# Patient Record
Sex: Female | Born: 1960 | Race: White | Hispanic: No | Smoking: Current every day smoker
Health system: Southern US, Community
[De-identification: ages and names within clinical notes are randomized; demographics above are authoritative.]

## PROBLEM LIST (undated history)

## (undated) DIAGNOSIS — R87619 Unspecified abnormal cytological findings in specimens from cervix uteri: Secondary | ICD-10-CM

## (undated) DIAGNOSIS — G8929 Other chronic pain: Secondary | ICD-10-CM

## (undated) DIAGNOSIS — M1990 Unspecified osteoarthritis, unspecified site: Secondary | ICD-10-CM

## (undated) DIAGNOSIS — M549 Dorsalgia, unspecified: Secondary | ICD-10-CM

## (undated) DIAGNOSIS — N879 Dysplasia of cervix uteri, unspecified: Secondary | ICD-10-CM

## (undated) HISTORY — PX: COLONOSCOPY: SHX174

## (undated) HISTORY — DX: Unspecified abnormal cytological findings in specimens from cervix uteri: R87.619

## (undated) HISTORY — DX: Dysplasia of cervix uteri, unspecified: N87.9

## (undated) HISTORY — PX: DIAGNOSTIC MAMMOGRAM: HXRAD719

## (undated) HISTORY — DX: Unspecified osteoarthritis, unspecified site: M19.90

---

## 1997-11-22 ENCOUNTER — Other Ambulatory Visit: Admission: RE | Admit: 1997-11-22 | Discharge: 1997-11-22 | Payer: Self-pay | Admitting: Obstetrics and Gynecology

## 1999-07-31 ENCOUNTER — Other Ambulatory Visit: Admission: RE | Admit: 1999-07-31 | Discharge: 1999-07-31 | Payer: Self-pay | Admitting: Obstetrics & Gynecology

## 2000-09-26 ENCOUNTER — Other Ambulatory Visit: Admission: RE | Admit: 2000-09-26 | Discharge: 2000-09-26 | Payer: Self-pay | Admitting: Obstetrics and Gynecology

## 2001-01-28 ENCOUNTER — Ambulatory Visit (HOSPITAL_COMMUNITY): Admission: RE | Admit: 2001-01-28 | Discharge: 2001-01-28 | Payer: Self-pay | Admitting: Obstetrics and Gynecology

## 2001-01-28 ENCOUNTER — Encounter: Payer: Self-pay | Admitting: Obstetrics and Gynecology

## 2002-03-31 ENCOUNTER — Other Ambulatory Visit: Admission: RE | Admit: 2002-03-31 | Discharge: 2002-03-31 | Payer: Self-pay | Admitting: Obstetrics and Gynecology

## 2002-03-31 ENCOUNTER — Ambulatory Visit (HOSPITAL_COMMUNITY): Admission: RE | Admit: 2002-03-31 | Discharge: 2002-03-31 | Payer: Self-pay | Admitting: Obstetrics and Gynecology

## 2002-03-31 ENCOUNTER — Encounter: Payer: Self-pay | Admitting: Obstetrics and Gynecology

## 2002-04-06 ENCOUNTER — Encounter: Admission: RE | Admit: 2002-04-06 | Discharge: 2002-04-06 | Payer: Self-pay | Admitting: Obstetrics and Gynecology

## 2002-04-06 ENCOUNTER — Encounter: Payer: Self-pay | Admitting: Obstetrics and Gynecology

## 2002-04-10 ENCOUNTER — Encounter: Admission: RE | Admit: 2002-04-10 | Discharge: 2002-04-10 | Payer: Self-pay | Admitting: Gastroenterology

## 2002-04-10 ENCOUNTER — Encounter: Payer: Self-pay | Admitting: Gastroenterology

## 2002-11-27 ENCOUNTER — Ambulatory Visit (HOSPITAL_COMMUNITY): Admission: RE | Admit: 2002-11-27 | Discharge: 2002-11-27 | Payer: Self-pay | Admitting: Gastroenterology

## 2003-04-15 ENCOUNTER — Other Ambulatory Visit: Admission: RE | Admit: 2003-04-15 | Discharge: 2003-04-15 | Payer: Self-pay | Admitting: Obstetrics and Gynecology

## 2003-06-29 ENCOUNTER — Ambulatory Visit (HOSPITAL_COMMUNITY): Admission: RE | Admit: 2003-06-29 | Discharge: 2003-06-29 | Payer: Self-pay | Admitting: Obstetrics and Gynecology

## 2003-12-22 ENCOUNTER — Ambulatory Visit (HOSPITAL_COMMUNITY): Admission: RE | Admit: 2003-12-22 | Discharge: 2003-12-22 | Payer: Self-pay | Admitting: Gastroenterology

## 2004-07-07 ENCOUNTER — Ambulatory Visit (HOSPITAL_COMMUNITY): Admission: RE | Admit: 2004-07-07 | Discharge: 2004-07-07 | Payer: Self-pay | Admitting: Obstetrics and Gynecology

## 2005-09-18 ENCOUNTER — Encounter: Admission: RE | Admit: 2005-09-18 | Discharge: 2005-09-18 | Payer: Self-pay | Admitting: Obstetrics and Gynecology

## 2006-09-30 ENCOUNTER — Ambulatory Visit (HOSPITAL_COMMUNITY): Admission: RE | Admit: 2006-09-30 | Discharge: 2006-09-30 | Payer: Self-pay | Admitting: Obstetrics and Gynecology

## 2006-10-10 ENCOUNTER — Encounter: Admission: RE | Admit: 2006-10-10 | Discharge: 2006-10-10 | Payer: Self-pay | Admitting: Obstetrics and Gynecology

## 2008-02-17 ENCOUNTER — Ambulatory Visit (HOSPITAL_COMMUNITY): Admission: RE | Admit: 2008-02-17 | Discharge: 2008-02-17 | Payer: Self-pay | Admitting: Obstetrics and Gynecology

## 2008-02-17 IMAGING — MG MM DIGITAL SCREENING BILAT
4 series · 4 of 4 positions shown · non-contrast
Comparison: none

DG SCREEN MAMMOGRAM BILATERAL
Bilateral CC and MLO view(s) were taken.
Technologist: PAMCITA.(PAMCITA)(M)

DIGITAL SCREENING MAMMOGRAM WITH CAD:
The breast tissue is heterogeneously dense.  No masses or malignant type calcifications are 
identified.  Compared with prior studies.

[R CC]
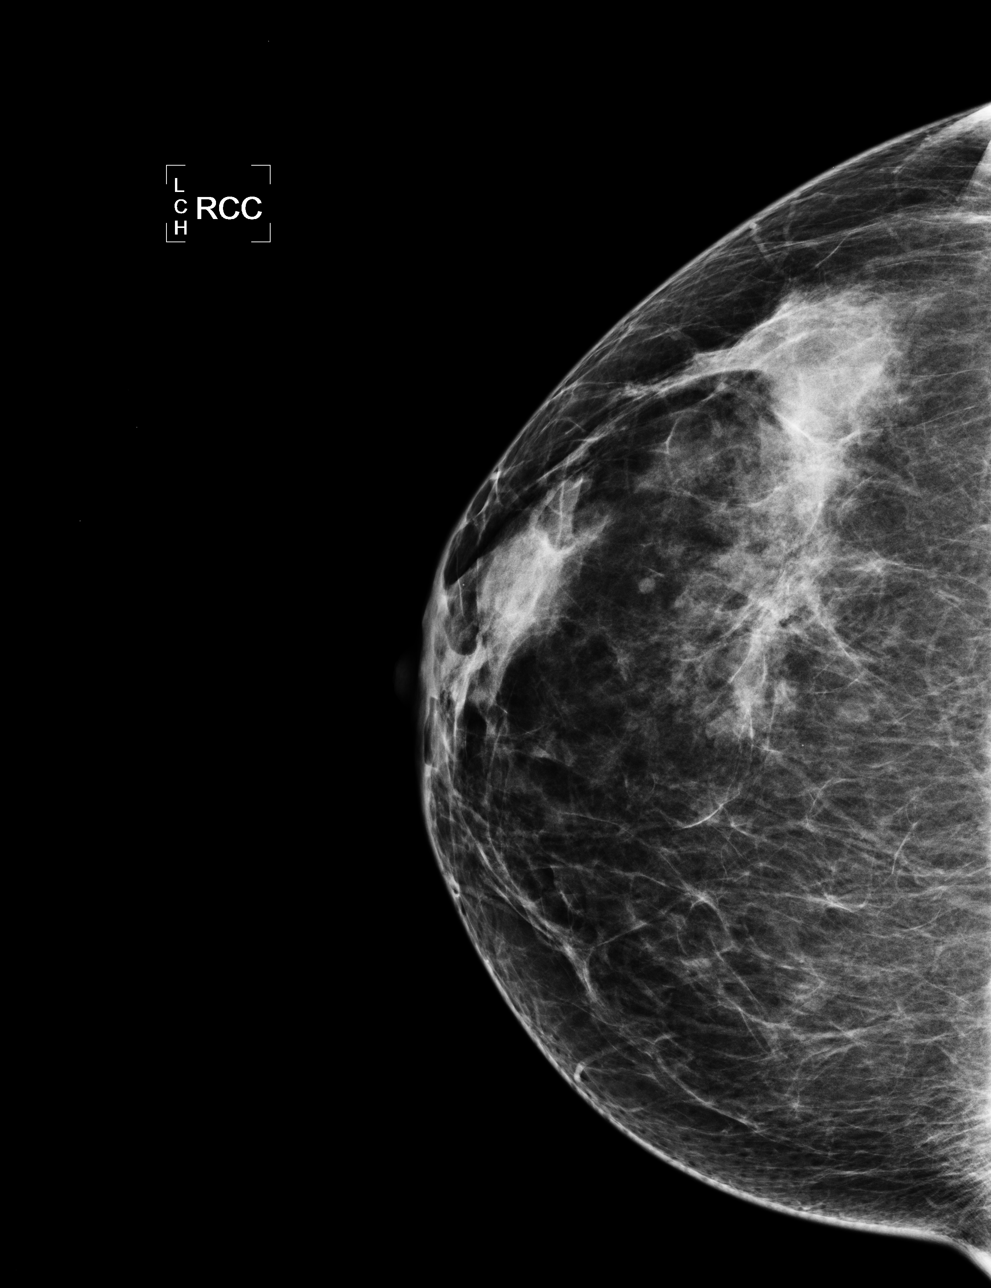

[R MLO]
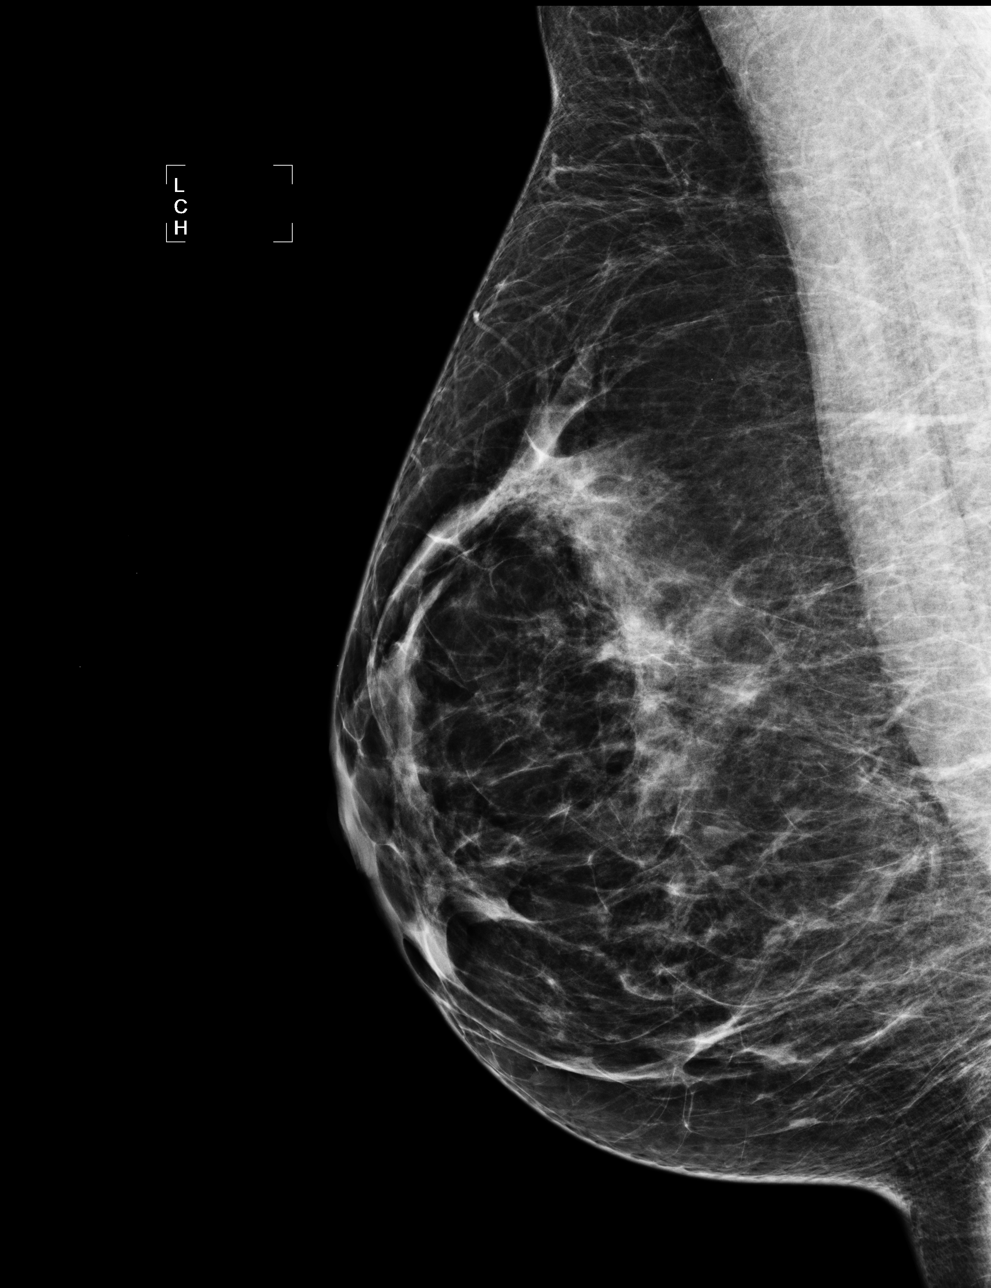

[L CC]
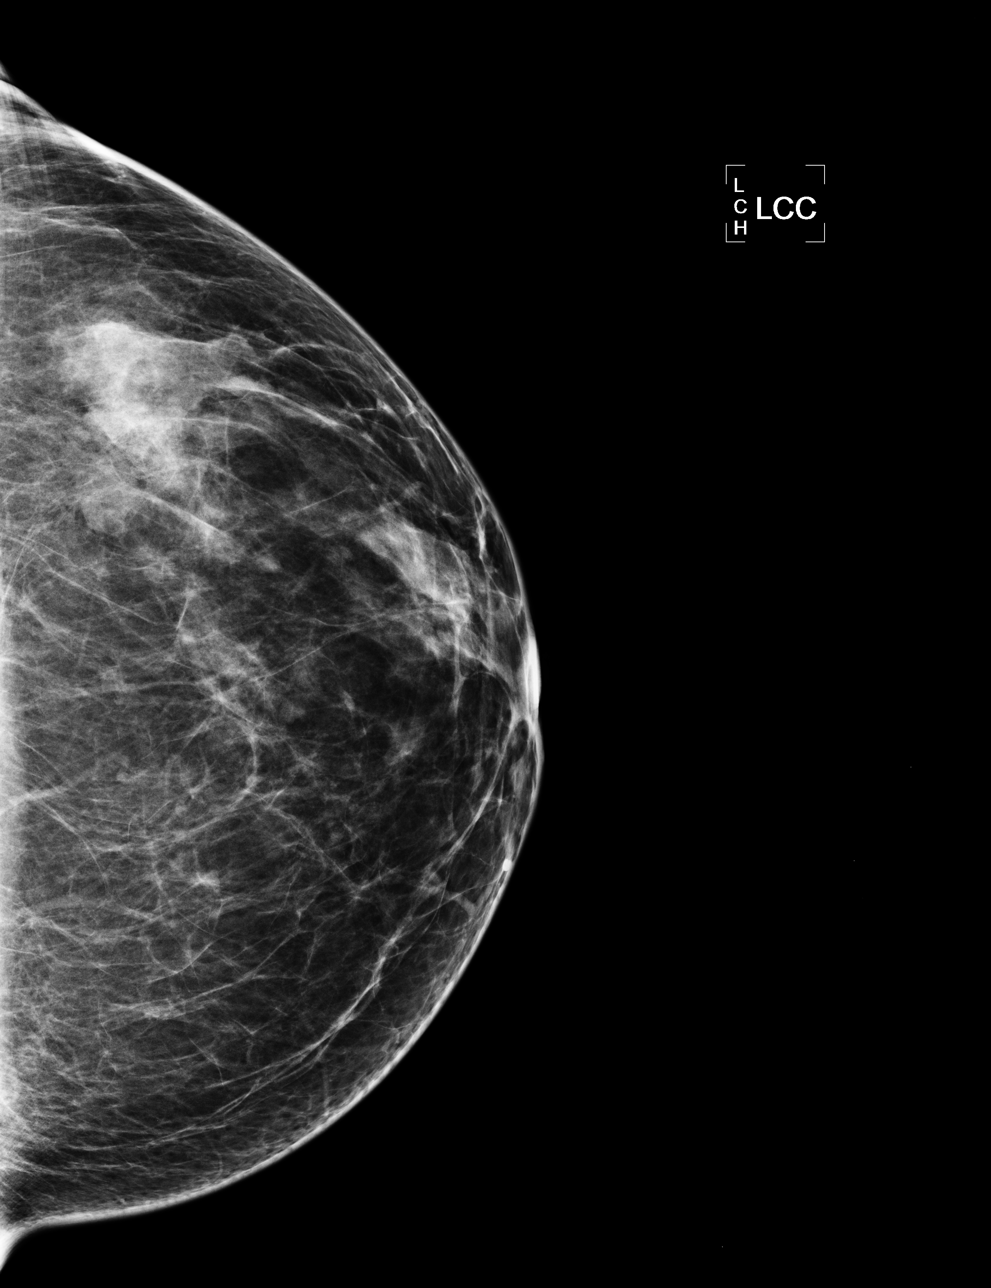

[L MLO]
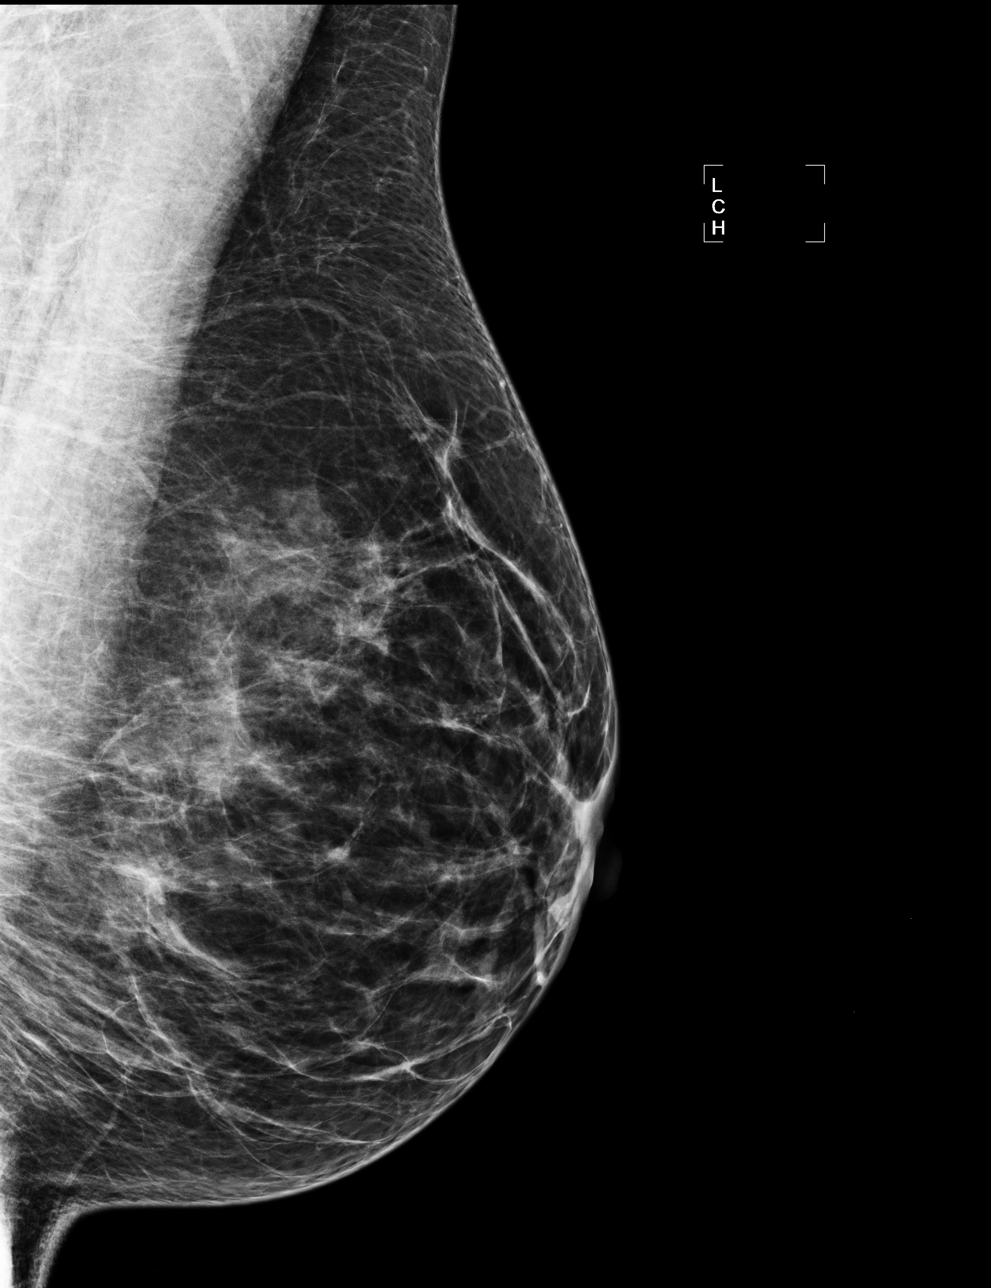

[4 of 4 positions shown; findings below may reference images not displayed]

IMPRESSION: No specific mammographic evidence of malignancy.  Next screening mammogram is recommended in one 
year.

ASSESSMENT: Negative - BI-RADS 1

Screening mammogram in 1 year.
ANALYZED BY COMPUTER AIDED DETECTION. , THIS PROCEDURE WAS A DIGITAL MAMMOGRAM.

## 2010-01-05 ENCOUNTER — Ambulatory Visit: Payer: Self-pay | Admitting: Internal Medicine

## 2010-01-19 ENCOUNTER — Ambulatory Visit (HOSPITAL_COMMUNITY)
Admission: RE | Admit: 2010-01-19 | Discharge: 2010-01-19 | Payer: Self-pay | Source: Home / Self Care | Admitting: Family Medicine

## 2010-02-02 ENCOUNTER — Ambulatory Visit: Payer: Self-pay | Admitting: Internal Medicine

## 2010-02-21 ENCOUNTER — Ambulatory Visit: Payer: Self-pay | Admitting: Internal Medicine

## 2010-03-14 ENCOUNTER — Ambulatory Visit: Payer: Self-pay | Admitting: Family Medicine

## 2010-07-10 ENCOUNTER — Encounter
Admission: RE | Admit: 2010-07-10 | Discharge: 2010-08-08 | Payer: Self-pay | Source: Home / Self Care | Attending: Physical Medicine and Rehabilitation | Admitting: Physical Medicine and Rehabilitation

## 2010-07-12 ENCOUNTER — Ambulatory Visit
Admission: RE | Admit: 2010-07-12 | Discharge: 2010-07-12 | Payer: Self-pay | Source: Home / Self Care | Attending: Physical Medicine and Rehabilitation | Admitting: Physical Medicine and Rehabilitation

## 2010-07-29 ENCOUNTER — Encounter: Payer: Self-pay | Admitting: Obstetrics and Gynecology

## 2010-07-30 ENCOUNTER — Encounter: Payer: Self-pay | Admitting: Obstetrics and Gynecology

## 2010-08-21 ENCOUNTER — Ambulatory Visit: Payer: Self-pay | Admitting: Physical Medicine and Rehabilitation

## 2010-09-20 ENCOUNTER — Encounter: Payer: Self-pay | Admitting: Internal Medicine

## 2010-09-20 LAB — CONVERTED CEMR LAB
Albumin: 4.5 g/dL (ref 3.5–5.2)
Alkaline Phosphatase: 79 units/L (ref 39–117)
BUN: 10 mg/dL (ref 6–23)
Calcium: 10 mg/dL (ref 8.4–10.5)
Chloride: 105 meq/L (ref 96–112)
Creatinine, Ser: 0.77 mg/dL (ref 0.40–1.20)
Glucose, Bld: 91 mg/dL (ref 70–99)
HCT: 42.2 % (ref 36.0–46.0)
Hemoglobin: 13.7 g/dL (ref 12.0–15.0)
MCHC: 32.5 g/dL (ref 30.0–36.0)
Potassium: 5.3 meq/L (ref 3.5–5.3)
RBC: 4.52 M/uL (ref 3.87–5.11)
RDW: 14.6 % (ref 11.5–15.5)

## 2010-10-04 ENCOUNTER — Ambulatory Visit: Payer: Medicaid Other | Admitting: Physical Medicine and Rehabilitation

## 2010-10-04 ENCOUNTER — Encounter: Payer: Medicaid Other | Attending: Physical Medicine and Rehabilitation

## 2010-10-04 DIAGNOSIS — M51379 Other intervertebral disc degeneration, lumbosacral region without mention of lumbar back pain or lower extremity pain: Secondary | ICD-10-CM | POA: Insufficient documentation

## 2010-10-04 DIAGNOSIS — M545 Low back pain, unspecified: Secondary | ICD-10-CM

## 2010-10-04 DIAGNOSIS — F329 Major depressive disorder, single episode, unspecified: Secondary | ICD-10-CM | POA: Insufficient documentation

## 2010-10-04 DIAGNOSIS — M5137 Other intervertebral disc degeneration, lumbosacral region: Secondary | ICD-10-CM | POA: Insufficient documentation

## 2010-10-04 DIAGNOSIS — F3289 Other specified depressive episodes: Secondary | ICD-10-CM | POA: Insufficient documentation

## 2010-10-04 DIAGNOSIS — G8929 Other chronic pain: Secondary | ICD-10-CM | POA: Insufficient documentation

## 2010-10-04 DIAGNOSIS — M5430 Sciatica, unspecified side: Secondary | ICD-10-CM

## 2010-10-04 DIAGNOSIS — G894 Chronic pain syndrome: Secondary | ICD-10-CM

## 2010-10-04 DIAGNOSIS — M129 Arthropathy, unspecified: Secondary | ICD-10-CM | POA: Insufficient documentation

## 2010-10-04 DIAGNOSIS — Z79899 Other long term (current) drug therapy: Secondary | ICD-10-CM | POA: Insufficient documentation

## 2010-10-18 ENCOUNTER — Ambulatory Visit: Payer: Self-pay | Admitting: Rehabilitation

## 2010-10-27 ENCOUNTER — Ambulatory Visit
Payer: Medicaid Other | Attending: Physical Medicine and Rehabilitation | Admitting: Rehabilitative and Restorative Service Providers"

## 2010-10-27 DIAGNOSIS — M545 Low back pain, unspecified: Secondary | ICD-10-CM | POA: Insufficient documentation

## 2010-10-27 DIAGNOSIS — IMO0001 Reserved for inherently not codable concepts without codable children: Secondary | ICD-10-CM | POA: Insufficient documentation

## 2010-10-31 ENCOUNTER — Ambulatory Visit: Payer: Medicaid Other | Admitting: Rehabilitative and Restorative Service Providers"

## 2010-11-02 ENCOUNTER — Ambulatory Visit: Payer: Medicaid Other | Admitting: Rehabilitative and Restorative Service Providers"

## 2010-11-06 ENCOUNTER — Encounter
Payer: Medicaid Other | Attending: Physical Medicine and Rehabilitation | Admitting: Physical Medicine and Rehabilitation

## 2010-11-06 DIAGNOSIS — M5137 Other intervertebral disc degeneration, lumbosacral region: Secondary | ICD-10-CM | POA: Insufficient documentation

## 2010-11-06 DIAGNOSIS — M545 Low back pain, unspecified: Secondary | ICD-10-CM

## 2010-11-06 DIAGNOSIS — M129 Arthropathy, unspecified: Secondary | ICD-10-CM | POA: Insufficient documentation

## 2010-11-06 DIAGNOSIS — F329 Major depressive disorder, single episode, unspecified: Secondary | ICD-10-CM | POA: Insufficient documentation

## 2010-11-06 DIAGNOSIS — F3289 Other specified depressive episodes: Secondary | ICD-10-CM | POA: Insufficient documentation

## 2010-11-06 DIAGNOSIS — M51379 Other intervertebral disc degeneration, lumbosacral region without mention of lumbar back pain or lower extremity pain: Secondary | ICD-10-CM | POA: Insufficient documentation

## 2010-11-06 DIAGNOSIS — G894 Chronic pain syndrome: Secondary | ICD-10-CM

## 2010-11-06 DIAGNOSIS — M7710 Lateral epicondylitis, unspecified elbow: Secondary | ICD-10-CM | POA: Insufficient documentation

## 2010-11-06 DIAGNOSIS — G8929 Other chronic pain: Secondary | ICD-10-CM | POA: Insufficient documentation

## 2010-11-06 NOTE — Assessment & Plan Note (Signed)
[name] is a 50 year old woman who is followed in our [facility] for chronic low back pain.  She was last seen by me on October 04, 2010.  She has been started in a physical therapy program with an emphasis on body mechanics, core strengthening, lower extremity flexibility.  It took her about 2-3 weeks to get into the program.  She has had about 1 or 2 visits at this point, mostly evaluation.  She also reports continued low back pain, averaging about 9 on a scale of 10, worse with flexion, extension, and prolonged standing, worse in the morning with stiffness.  She has been using hydrocodone in the morning to help with the achiness and stiffness in her low back.  No new problems with respect to lower extremity pain, numbness, or weakness.  She does complain of some left lateral elbow pain which she notices when she has been using the arm, grabbing objects.  She gets a sharp pain in the left lateral elbow.  She saw her primary care who suggested possibly using some Neurontin.  She has not been taking it at this point.  No other changes in past medical, social, or family history.  FUNCTIONAL STATUS:  She can walk 30 minutes at time.  She is independent with her self-care.  Medications [facility] through [facility] include hydrocodone/acetaminophen 5/325 one p.o. b.i.d.  PHYSICAL EXAMINATION:  VITAL SIGNS:  Today, blood pressure is 116/72, pulse 80, respirations 18, 95% saturated on room air. GENERAL:  She is a well-developed, well-nourished woman who does not appear in any distress.  She is oriented x3.  Speech is clear.  Her affect is bright.  She is smiling.  She is alert, cooperative, and pleasant.  Follows commands without difficulty.  Answers my questions appropriately. NEUROLOGIC:  Cranial nerves coordination are grossly intact. MUSCULOSKELETAL:  Her reflexes are 2+ at bilateral biceps, triceps, and brachioradialis, 2+ at the  patellar and Achilles tendons without abnormal tone, clonus, or tremors.  Hoffmann negative.  Bilateral downgoing toes and no clonus is appreciated.  No sensory deficits are noted today in upper or lower extremities.  Her motor strength is good in both upper and lower extremities.  Transitioning from sitting to standing done with ease.  Gait in the room is normal, non-antalgic. Tandem gait and Romberg test all performed adequately.  She has relatively well-preserved lumbar motion but does complain of some pain with end range forward flexion as well as extension, some mild paraspinal lumbar muscle tenderness is noted as well.  Evaluation of her left upper extremity, again no sensory deficits.  Her motor strength is good.  Normal pulse.  No edema in the left upper extremity.  Pain is exacerbated with extension of the left wrist.  She also has point tenderness to palpation along the lateral epicondyle.  IMPRESSION: 1. New mild left lateral epicondylitis.  Pain is provoked with     occasional use of the left upper extremity, grasping objects,     holding objects.  She reports that some days she does not have the     pain, some days she has briefly 3-4 times a day, lasting not more     than 5 seconds. 2. Chronic low back pain. 3. Lumbar degenerative disk disease/facet arthropathy without evidence     of sciatica. 4. Mild superimposed depression.  PLAN:  I have asked her to ice her left lateral epicondyle at intervals throughout the day.  I have given  her a prescription for Flector patch. Reviewed risks and benefits of this medication and I will refill her Norco 5/325 one p.o. b.i.d., #60.  Encouraged her to continue physical therapy as indicated earlier, p.r.n. icy hot patch, and TENS unit.  She is comfortable with our management plan.  I have answered all of her questions.  She wishes to hold off on gabapentin at this time. This was not prescribed through this clinic.     Brantley Stage, M.D. Electronically Signed    DMK/MedQ D:  11/06/2010 10:25:52  T:  11/06/2010 22:58:30  Job #:  [id number]

## 2010-11-07 ENCOUNTER — Encounter: Payer: Self-pay | Admitting: Rehabilitative and Restorative Service Providers"

## 2010-11-09 ENCOUNTER — Encounter: Payer: Self-pay | Admitting: Physical Therapy

## 2010-11-24 NOTE — Op Note (Signed)
NAME:  Latoya Collier, Latoya Collier                         ACCOUNT NO.:  [id number]   MEDICAL RECORD NO.:  [id number]                   PATIENT TYPE:  AMB   LOCATION:  ENDO                                 FACILITY:  [facility]   PHYSICIAN:  Anselmo Rod, M.D.               DATE OF BIRTH:  1961/01/05   DATE OF PROCEDURE:  12/22/2003  DATE OF DISCHARGE:                                 OPERATIVE REPORT   PROCEDURE PERFORMED:  Screening colonoscopy.   ENDOSCOPIST:  Charna Elizabeth, M.D.   INSTRUMENT USED:  Olympus video colonoscope.   INDICATIONS FOR PROCEDURE:  Rectal bleeding in a 50 year old white female.  Rule out colonic polyps, masses, etc.   PREPROCEDURE PREPARATION:  Informed consent was procured from the patient.  The patient was fasted for eight hours prior to the procedure and prepped  with a bottle of magnesium citrate and a gallon of GoLYTELY the night prior  to the procedure.   PREPROCEDURE PHYSICAL:  The patient had stable vital signs.  Neck supple.  Chest clear to auscultation.  S1 and S2 regular.  Abdomen soft with normal  bowel sounds.   DESCRIPTION OF PROCEDURE:  The patient was placed in left lateral decubitus  position and sedated with 75 mg of Demerol and 7.5 mg of Versed  intravenously.  Once the patient was adequately sedated and maintained on  low flow oxygen and continuous cardiac monitoring, the Olympus video  colonoscope was advanced from the rectum to the cecum.  The appendicular  orifice and ileocecal valve were clearly visualized and photographed.  No  masses, polyps, erosions, ulcerations or diverticula were seen.  Small  internal hemorrhoids were appreciated on retroflexion in the rectum.  The  patient tolerated the procedure well without complications.   IMPRESSION:  Internal hemorrhoids.  Otherwise normal colonoscopy up to the  cecum.   RECOMMENDATIONS:  1. Continue high fiber diet with liberal fluid intake.  2. Repeat colonoscopy in the next 10 years unless  the patient develops any     abnormal symptoms in the interim.  3. Use stool softeners as needed to prevent rectal irritation and subsequent     bleeding.  4. Outpatient followup in the next four weeks for further recommendations.                                               Anselmo Rod, M.D.    JNM/MEDQ  D:  12/22/2003  T:  12/22/2003  Job:  [id number]   cc:   Jonita Albee, M.D.  [facility]  [address]  [place]  [place] [address]  Fax: [phone]

## 2010-11-24 NOTE — Op Note (Signed)
   NAME:  Latoya Collier, Latoya Collier                         ACCOUNT NO.:  [id number]   MEDICAL RECORD NO.:  [id number]                   PATIENT TYPE:  AMB   LOCATION:  ENDO                                 FACILITY:  [facility]   PHYSICIAN:  Anselmo Rod, M.D.               DATE OF BIRTH:  08/19/1960   DATE OF PROCEDURE:  11/27/2002  DATE OF DISCHARGE:                                 OPERATIVE REPORT   PROCEDURE:  Esophagogastroduodenoscopy.   ENDOSCOPIST:  Anselmo Rod, M.D.   INSTRUMENT USED:  Olympus video panendoscope.   INDICATIONS FOR PROCEDURE:  Epigastric pain in a 50 year old white female,  rule out ulcer disease.  The patient is not responding to PPI's.   PREPROCEDURE PREPARATION:  Informed consent was procured from the patient.  The patient was fasted for eight hours prior to the procedure.   PREPROCEDURE PHYSICAL EXAMINATION:  VITAL SIGNS:  Stable.  NECK:  Supple.  CHEST:  Clear to auscultation.  ABDOMEN:  Soft with normal bowel sounds.   DESCRIPTION OF PROCEDURE:  The patient was placed in the left lateral  decubitus position and sedated with 100 mg of Demerol and 10 mg of Versed  intravenously.  Once the patient was adequately sedated and maintained on  low flow oxygen and continuous cardiac monitoring, the Olympus video  panendoscope was advanced through the mouthpiece, over the tongue, and into  the esophagus under direct vision. The entire esophagus, stomach, and  proximal small bowel appeared normal.  No ulcers, erosions, masses, or  polyps were seen.  Retroflexion in the high cardiac revealed no abnormality.   IMPRESSION:  Normal EKG.   RECOMMENDATIONS:  Continue PPI's.  Trial of anxiolytis.  Outpatient follow-  up in the next two weeks or earlier if need be.                                               Anselmo Rod, M.D.  JNM/MEDQ  D:  11/27/2002  T:  11/28/2002  Job:  [id number]   cc:   Jonita Albee, M.D.  [facility]  [address]  [place]  [place] [address]  Fax: [phone]

## 2010-12-01 ENCOUNTER — Encounter: Payer: Medicaid Other | Admitting: Physical Medicine and Rehabilitation

## 2010-12-05 ENCOUNTER — Encounter: Payer: Medicaid Other | Attending: Neurosurgery | Admitting: Neurosurgery

## 2010-12-05 DIAGNOSIS — Z79899 Other long term (current) drug therapy: Secondary | ICD-10-CM | POA: Insufficient documentation

## 2010-12-05 DIAGNOSIS — M7710 Lateral epicondylitis, unspecified elbow: Secondary | ICD-10-CM | POA: Insufficient documentation

## 2010-12-05 DIAGNOSIS — IMO0002 Reserved for concepts with insufficient information to code with codable children: Secondary | ICD-10-CM | POA: Insufficient documentation

## 2010-12-05 DIAGNOSIS — M25529 Pain in unspecified elbow: Secondary | ICD-10-CM | POA: Insufficient documentation

## 2010-12-05 DIAGNOSIS — F3289 Other specified depressive episodes: Secondary | ICD-10-CM | POA: Insufficient documentation

## 2010-12-05 DIAGNOSIS — M545 Low back pain, unspecified: Secondary | ICD-10-CM | POA: Insufficient documentation

## 2010-12-05 DIAGNOSIS — G894 Chronic pain syndrome: Secondary | ICD-10-CM

## 2010-12-05 DIAGNOSIS — F329 Major depressive disorder, single episode, unspecified: Secondary | ICD-10-CM | POA: Insufficient documentation

## 2010-12-06 NOTE — Assessment & Plan Note (Signed)
Latoya Collier is a 50 year old woman with chronic low back pain.  She was initially seen at our [facility] on July 12, 2010.  She is back in today for refill of her pain medications.  Her average pain is about 8 or 9 on a scale of 10, worse with prolonged standing, bending or sitting.  She reports fair relief with current meds.  She typically takes not more than 2 tablets of Norco 5/325.  During the day, pain is described as sharp and aching, it waxes and wanes in intensity.  She has a history of H. Pylori and cannot really take nonsteroidal anti- inflammatory medications.  Functional status, she can walk 30 minutes at a time.  She is independent with self-care.  Denies problems with bowel or bladder. Denies suicidal ideation.  Does admit to some depression.  No other change in past medical, social or family history, cautioned against driving after taking medications, cautioned against not using alcohol of using this medication as well.  On exam today, her blood pressure is 142/85, pulse 66, respirations 18, 95% saturated on room air.  She is well-developed obese woman who does not appear in any distress.  She is oriented x3.  Speech is clear. Affect is bright.  She is alert, cooperative and pleasant.  Follows commands without difficulty.  Answers my questions appropriately. Cranial nerves, coordination are intact.  Reflexes are intact in the upper and lower extremities without abnormal tone, clonus, or tremors.  Motor strength is good, 5/5 in upper as well as lower extremities. Transitioning from sitting to standing is done with ease.  Gait is not antalgic.  Tandem gait and Romberg test are performed adequately.  She has relatively well-preserved range of motion in her neck and shoulders.  Low back, she complains of some pain with forward flexion. Reports some improvement with extension.  Tandem gait and Romberg test are all performed  adequately.  Internal and external rotation at the hips does not exacerbate any pain in the posterior hip or groin.  She has no tenderness over the trochanters today as well.  IMPRESSION: 1. Chronic low back pain. 2. Lumbar degenerative disk disease/facet arthropathy without evidence     of sciatica. 3. Mild superimposed depression.  PLAN:  Urine drug screen at the last visit was checked, her hydrocodone was in the consistent range.  Recommend using IcyHot patches and TENS unit p.r.n.  Would also like to see her in some physical therapy to address core strengthening exercises as well as education of proper body mechanics and lower extremity flexibility and strength.  Answered all of her questions.  She is comfortable with this plan at this time.  We will continue to follow her.  May consider repeat MRI, it has been about 3 years since her last MRI, should she be interested in more invasive means of managing her low back pain such as medial branch blocks.     Brantley Stage, M.D. Electronically Signed    DMK/MedQ D:  10/04/2010 12:03:18  T:  10/04/2010 21:31:00  Job #:  [id number]

## 2010-12-06 NOTE — Assessment & Plan Note (Signed)
Latoya Collier is a patient of Dr. Leretha Dykes who follows up it appears for complaints of chronic low back pain.  She was recommended to start on physical therapy.  She has not started yet, but she does have it scheduled.  The patient also has some right elbow pain from time to time.  She rates her pain about 9, sharp, and stabbing.  General activity level is about 7-9.  Pain is worse in the morning and during the day.  Sleep patterns are fair.  Most of all activities aggravate. Medications tend to help, although she is asked for more.  REVIEW OF SYSTEMS:  Notable for those difficulties as well as some depression, otherwise within normal limits.  PAST MEDICAL HISTORY:  Unchanged.  SOCIAL HISTORY:  Unchanged.  PHYSICAL EXAMINATION:  Blood pressure 137/76, pulse 80, respirations 18, and O2 sats 98% on room air.  Motor strength is 5/5 in the lower extremities.  Sensation is intact.  Range of motion is good.  She does well with somewhat of an altered gait due to back pain. Constitutionally, she is within normal limits.  Alert and oriented x3. Affect is bright.  No signs of aberrant behavior.  Her Oswestry score is 52.  IMPRESSION: 1. Mild lateral epicondylitis. 2. Chronic low back pain. 3. Degenerative disk disease. 4. Depression.  PLAN: 1. We went ahead and refilled her Norco 5/325 one p.o. b.i.d., #60     with no refill. 2. She will follow up with nurse in 1 month.  Her questions were     encouraged and answered. 3. She will start physical therapy as soon as possible.     Alyx Gee L. Blima Dessert Electronically Signed    RLW/MedQ D:  12/05/2010 11:07:14  T:  12/06/2010 00:18:36  Job #:  [id number]

## 2010-12-20 ENCOUNTER — Encounter: Payer: Medicaid Other | Admitting: Physical Therapy

## 2011-01-03 ENCOUNTER — Encounter
Payer: Medicaid Other | Attending: Physical Medicine and Rehabilitation | Admitting: Physical Medicine and Rehabilitation

## 2011-01-03 DIAGNOSIS — M545 Low back pain, unspecified: Secondary | ICD-10-CM | POA: Insufficient documentation

## 2011-01-03 DIAGNOSIS — M5380 Other specified dorsopathies, site unspecified: Secondary | ICD-10-CM

## 2011-01-03 DIAGNOSIS — M5137 Other intervertebral disc degeneration, lumbosacral region: Secondary | ICD-10-CM | POA: Insufficient documentation

## 2011-01-03 DIAGNOSIS — M7710 Lateral epicondylitis, unspecified elbow: Secondary | ICD-10-CM | POA: Insufficient documentation

## 2011-01-03 DIAGNOSIS — M129 Arthropathy, unspecified: Secondary | ICD-10-CM | POA: Insufficient documentation

## 2011-01-03 DIAGNOSIS — F3289 Other specified depressive episodes: Secondary | ICD-10-CM | POA: Insufficient documentation

## 2011-01-03 DIAGNOSIS — F329 Major depressive disorder, single episode, unspecified: Secondary | ICD-10-CM | POA: Insufficient documentation

## 2011-01-03 DIAGNOSIS — M51379 Other intervertebral disc degeneration, lumbosacral region without mention of lumbar back pain or lower extremity pain: Secondary | ICD-10-CM | POA: Insufficient documentation

## 2011-01-03 DIAGNOSIS — G894 Chronic pain syndrome: Secondary | ICD-10-CM

## 2011-01-04 NOTE — Assessment & Plan Note (Signed)
[name] is a pleasant, 50 year old woman who is following back up at our [facility] for chronic pain complaints related to her low back.  She was last seen by me on November 06, 2010.  In the interim, she is seeing Kallie Edward, nurse practitioner.  She is requesting a refill of her pain medications today. She had been given some physical therapy orders to address low back pain, however, she has had some problems with some left lateral epicondylitis as well, however, she still has not started her therapy program at this point.  She is requesting another order to get started.  Her average pain has been about 8 on a scale of 10, predominantly low back pain.  Pain is worse with walking and predominantly prolonged sitting and bending bothers.  Pain improves with medication.  She reports fair to good relief with current meds.  FUNCTIONAL STATUS:  She can walk 20 minutes at a time.  She does drive. She is independent with self care.  Denies problems controlling bowel or bladder, depression anxiety, suicidal ideation.  No numbness, tingling, weakness, or tremors.  No changes in past medical, social, or family history.  PHYSICAL EXAMINATION:  VITAL SIGNS:  Blood pressure is 114/73, pulse 74, respirations 12, 97% saturated on room air. GENERAL:  She is a well-developed, well-nourished woman who does not appear in any distress.  She is oriented x3.  Speech is clear.  Affect is bright.  She is alert, cooperative, and pleasant.  Follows commands without difficulty, answers my questions appropriately.  Cranial nerves and coordination are intact. MUSCULOSKELETAL:  Her reflexes are 2+ at biceps, triceps, brachioradialis, 2+ at the patellar tendons as well as Achilles tendon. No abnormal tone, clonus, or tremors are noted.  Hoffmann sign is negative.  No sensory deficits are noted in upper and lower extremities.  Motor strength is good in both upper and lower  extremities.  She transitions easily from sitting to standing.  Gait is non-antalgic.  Tandem gait. Romberg test performed adequately.  She complains of pain with forward flexion.  She is relatively pain free with lumbar extension.  Left upper extremity is evaluated again.  She has some tenderness over the lateral epicondyle and pain is provoked with wrist extension.  IMPRESSION: 1. Resolving left lateral epicondylitis. 2. Chronic low back pain with degenerative disk disease/facet     arthropathy.  No evidence of sciatica. 3. Mild superimposed depression.  PLAN:  We have discussed treatment options again.  She would like to pursue physical therapy.  I have recommended education and proper body mechanics and posture, core strengthening program, and lower extremity flexibility program.  I will refill her hydrocodone 5/325, not more than 2 tablets per day.  Her last urine drug screen was in January of 2012, and was consistent. She takes her medications as prescribed.  No evidence of aberrant behaviors observed.  She reports relatively good relief with the use of medications.  I have answered all her questions.  She understands caution with respect to operating vehicles for machinery with these medications.  She is comfortable with our treatment plan.  I will see her back in 2 months, nurse practitioner visit next month.     Brantley Stage, M.D. Electronically Signed    DMK/MedQ D:  01/03/2011 13:50:44  T:  01/04/2011 04:54:09  Job #:  [id number]

## 2011-01-16 ENCOUNTER — Ambulatory Visit: Payer: Medicaid Other | Admitting: Rehabilitation

## 2011-01-25 ENCOUNTER — Other Ambulatory Visit (HOSPITAL_COMMUNITY): Payer: Self-pay | Admitting: Family Medicine

## 2011-01-25 DIAGNOSIS — Z1231 Encounter for screening mammogram for malignant neoplasm of breast: Secondary | ICD-10-CM

## 2011-01-29 ENCOUNTER — Ambulatory Visit: Payer: Self-pay | Attending: Neurosurgery | Admitting: Physical Therapy

## 2011-01-29 DIAGNOSIS — M545 Low back pain, unspecified: Secondary | ICD-10-CM | POA: Insufficient documentation

## 2011-01-29 DIAGNOSIS — IMO0001 Reserved for inherently not codable concepts without codable children: Secondary | ICD-10-CM | POA: Insufficient documentation

## 2011-01-31 ENCOUNTER — Encounter: Payer: Self-pay | Attending: Neurosurgery | Admitting: Neurosurgery

## 2011-01-31 DIAGNOSIS — M5430 Sciatica, unspecified side: Secondary | ICD-10-CM

## 2011-01-31 DIAGNOSIS — Z79899 Other long term (current) drug therapy: Secondary | ICD-10-CM | POA: Insufficient documentation

## 2011-01-31 DIAGNOSIS — M7710 Lateral epicondylitis, unspecified elbow: Secondary | ICD-10-CM | POA: Insufficient documentation

## 2011-01-31 DIAGNOSIS — M25529 Pain in unspecified elbow: Secondary | ICD-10-CM | POA: Insufficient documentation

## 2011-01-31 DIAGNOSIS — F3289 Other specified depressive episodes: Secondary | ICD-10-CM | POA: Insufficient documentation

## 2011-01-31 DIAGNOSIS — IMO0002 Reserved for concepts with insufficient information to code with codable children: Secondary | ICD-10-CM | POA: Insufficient documentation

## 2011-01-31 DIAGNOSIS — F329 Major depressive disorder, single episode, unspecified: Secondary | ICD-10-CM | POA: Insufficient documentation

## 2011-01-31 DIAGNOSIS — M545 Low back pain, unspecified: Secondary | ICD-10-CM | POA: Insufficient documentation

## 2011-01-31 NOTE — Assessment & Plan Note (Signed)
ACCOUNT:  [id number].  HISTORY:  [name] is patient of Dr. Pamelia Collier who follows up for chronic low back pain.  It is a right-sided low back pain that radiates into her buttock.  She states that she is in physical therapy.  She has only been to one visit.  She is going to increase that over the next few weeks.  She is requesting the MRI of her low back.  She states she has already talked with Dr. Pamelia Collier about this.  She reports to me her pain is unchanged from previous.  She states that the pain is getting worse.  She is basically here for a med refill and she will see Dr. Pamelia Collier back in a month.  She walks with somewhat of a limp.  She walks without assistance.  REVIEW OF SYSTEMS:  Notable for the difficulties described above, otherwise unremarkable.  PAST MEDICAL HISTORY:  Unchanged.  SOCIAL HISTORY:  Unchanged.  FAMILY HISTORY:  Unchanged.  PHYSICAL EXAMINATION:  Her blood pressure is 137/70, pulse 86, respirations are 18, O2 sats 96% on room air.  Motor strength is good in the lower extremities, is 5/5.  She does give-way on the right side and to resistance testing.  Her sensation is intact.  Constitution is within normal limits.  She is alert and oriented x3.  She does walk with a limp.  IMPRESSION: 1. Chronic low back pain. 2. Degenerative disk disease. 3. Depression. 4. Increasing intensity sciatic on the right.  PLAN: 1. Refill her Norco 5/325 one p.o. b.i.d., 60 with no refill. 2. She will continue her formal physical therapy though she follows up     with Dr. Pamelia Collier in 1 month. 3. She will discuss at that time possibility of an MRI and referral to     Neurosurgery.  She is in agreement with this plan.  Her questions     were encouraged and answered,     Latoya Collier L. Blima Dessert Electronically Signed    RLW/MedQ D:  01/31/2011 10:30:07  T:  01/31/2011 11:45:31  Job #:  [id number]

## 2011-02-06 ENCOUNTER — Ambulatory Visit (HOSPITAL_COMMUNITY): Payer: Self-pay

## 2011-02-07 ENCOUNTER — Ambulatory Visit: Payer: Self-pay | Admitting: Physical Therapy

## 2011-02-09 ENCOUNTER — Encounter: Payer: Medicaid Other | Admitting: Physical Therapy

## 2011-02-09 ENCOUNTER — Ambulatory Visit (HOSPITAL_COMMUNITY): Payer: Self-pay

## 2011-02-13 ENCOUNTER — Ambulatory Visit: Payer: Self-pay | Attending: Neurosurgery | Admitting: Physical Therapy

## 2011-02-13 DIAGNOSIS — IMO0001 Reserved for inherently not codable concepts without codable children: Secondary | ICD-10-CM | POA: Insufficient documentation

## 2011-02-13 DIAGNOSIS — M545 Low back pain, unspecified: Secondary | ICD-10-CM | POA: Insufficient documentation

## 2011-02-15 ENCOUNTER — Encounter: Payer: Medicaid Other | Admitting: Physical Therapy

## 2011-02-19 ENCOUNTER — Ambulatory Visit: Payer: Self-pay | Admitting: Physical Therapy

## 2011-02-28 ENCOUNTER — Encounter: Payer: Self-pay | Admitting: Neurosurgery

## 2011-03-01 ENCOUNTER — Ambulatory Visit: Payer: Self-pay | Admitting: Physical Therapy

## 2011-03-05 ENCOUNTER — Encounter: Payer: Self-pay | Attending: Neurosurgery | Admitting: Neurosurgery

## 2011-03-05 ENCOUNTER — Other Ambulatory Visit: Payer: Self-pay | Admitting: Physical Medicine and Rehabilitation

## 2011-03-05 DIAGNOSIS — M545 Low back pain, unspecified: Secondary | ICD-10-CM

## 2011-03-05 DIAGNOSIS — M7710 Lateral epicondylitis, unspecified elbow: Secondary | ICD-10-CM | POA: Insufficient documentation

## 2011-03-05 DIAGNOSIS — M129 Arthropathy, unspecified: Secondary | ICD-10-CM | POA: Insufficient documentation

## 2011-03-05 DIAGNOSIS — M51379 Other intervertebral disc degeneration, lumbosacral region without mention of lumbar back pain or lower extremity pain: Secondary | ICD-10-CM | POA: Insufficient documentation

## 2011-03-05 DIAGNOSIS — F3289 Other specified depressive episodes: Secondary | ICD-10-CM

## 2011-03-05 DIAGNOSIS — R52 Pain, unspecified: Secondary | ICD-10-CM

## 2011-03-05 DIAGNOSIS — M5137 Other intervertebral disc degeneration, lumbosacral region: Secondary | ICD-10-CM | POA: Insufficient documentation

## 2011-03-05 DIAGNOSIS — F329 Major depressive disorder, single episode, unspecified: Secondary | ICD-10-CM
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
## 2011-03-06 ENCOUNTER — Other Ambulatory Visit (HOSPITAL_COMMUNITY): Payer: Self-pay

## 2011-03-17 ENCOUNTER — Ambulatory Visit (HOSPITAL_COMMUNITY)
Admission: RE | Admit: 2011-03-17 | Discharge: 2011-03-17 | Disposition: A | Discharge status: Home / Self Care | Payer: Self-pay | Source: Ambulatory Visit | Attending: Physical Medicine and Rehabilitation | Admitting: Physical Medicine and Rehabilitation

## 2011-03-17 DIAGNOSIS — M51379 Other intervertebral disc degeneration, lumbosacral region without mention of lumbar back pain or lower extremity pain: Secondary | ICD-10-CM | POA: Insufficient documentation

## 2011-03-17 DIAGNOSIS — M5146 Schmorl's nodes, lumbar region: Secondary | ICD-10-CM | POA: Insufficient documentation

## 2011-03-17 DIAGNOSIS — M545 Low back pain, unspecified: Secondary | ICD-10-CM | POA: Insufficient documentation

## 2011-03-17 DIAGNOSIS — N289 Disorder of kidney and ureter, unspecified: Secondary | ICD-10-CM | POA: Insufficient documentation

## 2011-03-17 DIAGNOSIS — M4120 Other idiopathic scoliosis, site unspecified: Secondary | ICD-10-CM | POA: Insufficient documentation

## 2011-03-17 DIAGNOSIS — R52 Pain, unspecified: Secondary | ICD-10-CM

## 2011-03-17 DIAGNOSIS — M47817 Spondylosis without myelopathy or radiculopathy, lumbosacral region: Secondary | ICD-10-CM | POA: Insufficient documentation

## 2011-03-17 DIAGNOSIS — M5137 Other intervertebral disc degeneration, lumbosacral region: Secondary | ICD-10-CM | POA: Insufficient documentation

## 2011-04-03 ENCOUNTER — Encounter: Payer: Self-pay | Attending: Neurosurgery | Admitting: Neurosurgery

## 2011-04-03 DIAGNOSIS — M545 Low back pain, unspecified: Secondary | ICD-10-CM | POA: Insufficient documentation

## 2011-04-03 DIAGNOSIS — M51379 Other intervertebral disc degeneration, lumbosacral region without mention of lumbar back pain or lower extremity pain: Secondary | ICD-10-CM | POA: Insufficient documentation

## 2011-04-03 DIAGNOSIS — M129 Arthropathy, unspecified: Secondary | ICD-10-CM | POA: Insufficient documentation

## 2011-04-03 DIAGNOSIS — M7710 Lateral epicondylitis, unspecified elbow: Secondary | ICD-10-CM | POA: Insufficient documentation

## 2011-04-03 DIAGNOSIS — M47817 Spondylosis without myelopathy or radiculopathy, lumbosacral region: Secondary | ICD-10-CM | POA: Insufficient documentation

## 2011-04-03 DIAGNOSIS — M5137 Other intervertebral disc degeneration, lumbosacral region: Secondary | ICD-10-CM | POA: Insufficient documentation

## 2011-04-03 DIAGNOSIS — G8929 Other chronic pain: Secondary | ICD-10-CM | POA: Insufficient documentation

## 2011-04-03 DIAGNOSIS — M5430 Sciatica, unspecified side: Secondary | ICD-10-CM

## 2011-04-03 NOTE — Assessment & Plan Note (Signed)
ACCOUNT:  [id number].  This patient of Dr. Pamelia Hoit who has been seen for low back pain.  At last appointment, we did obtain an MRI that shows impingement due to spondylosis and degenerative disk disease of L3-4, L4-5, L5-1.  She reports some increase in her pain.  She is rating this an 7-8.  It is a sharp to dull aching pain, it is constant.  General activity level is 3/5.  Pain is worse in the morning and night, but sleep patterns are fair.  Pain is worse with all activities.  Medication tends to help. She walks without assistance.  She walk up 30 minutes at times.  Climb steps and drive.  She is unemployed.  REVIEW OF SYSTEMS:  Notable for difficulties described as above as well as some trouble walking, night sweats.  No aberrant behaviors or suicidal thoughts.  Pill counts were correct.  Last UDS in August was consistent.  PAST MEDICAL HISTORY, SOCIAL HISTORY AND FAMILY HISTORY:  Unchanged.  PHYSICAL EXAMINATION:  Her blood pressure is 125/80, pulse 81, respirations 16, O2 sats 96 on room air.  Her motor strength is 4/5 to confrontation testing in the lower extremities and iliopsoas, quadriceps due to pain.  She has intact sensation.  Constitutionally, within normal limits.  She is alert and oriented x3.  ASSESSMENT: 1. History of left lateral epicondylitis. 2. Low back pain with degenerative disk disease, facet arthropathy and     spondylosis new on MRI. 3. Chronic pain.  PLAN: 1. Refill Norco 5/325 one p.o. b.i.d. 60 with no refill. 2. Per her request, she is asked to be referred to Dr. Delma Officer at     [facility].  We made that referral today.  Her     questions were encouraged and answered.  We will see her back in     the clinic in 1 month.     Jubal Rademaker L. Blima Dessert Electronically Signed    RLW/MedQ D:  04/03/2011 12:39:07  T:  04/03/2011 13:35:35  Job #:  [id number]

## 2011-05-02 ENCOUNTER — Encounter: Payer: Self-pay | Admitting: Neurosurgery

## 2011-05-07 ENCOUNTER — Encounter: Payer: Self-pay | Attending: Neurosurgery | Admitting: Neurosurgery

## 2011-05-07 DIAGNOSIS — M545 Low back pain, unspecified: Secondary | ICD-10-CM | POA: Insufficient documentation

## 2011-05-07 DIAGNOSIS — G894 Chronic pain syndrome: Secondary | ICD-10-CM

## 2011-05-07 DIAGNOSIS — M7710 Lateral epicondylitis, unspecified elbow: Secondary | ICD-10-CM

## 2011-05-07 DIAGNOSIS — G8929 Other chronic pain: Secondary | ICD-10-CM | POA: Insufficient documentation

## 2011-05-07 DIAGNOSIS — M51379 Other intervertebral disc degeneration, lumbosacral region without mention of lumbar back pain or lower extremity pain: Secondary | ICD-10-CM | POA: Insufficient documentation

## 2011-05-07 DIAGNOSIS — M5137 Other intervertebral disc degeneration, lumbosacral region: Secondary | ICD-10-CM | POA: Insufficient documentation

## 2011-05-08 NOTE — Assessment & Plan Note (Signed)
HISTORY:  Patient of Dr. Pamelia Hoit, seen for low back pain.  Her last appointment MRI did show some spondylosis and DDD at L3-4, L4-5, L5-1. She is still rating her pain at 8, it is stabbing and aching type pain. General activity level was 7.  The pain is worse morning, daytime, evening.  All activities aggravate.  Heat and medication tend to help. Sleep patterns are fair.  She walks without assistance.  She walk about 30 minutes at a time.  She climb steps and drives.  Functionally, she is not employed.  REVIEW OF SYSTEMS:  Notable for difficulties described above as well as some depression.  No suicidal thoughts or aberrant behaviors.  Last UDS correct.  PAST MEDICAL HISTORY, SOCIAL HISTORY, AND FAMILY HISTORY:  Unchanged.  PHYSICAL EXAMINATION:  VITAL SIGNS:  Blood pressure is 120/75, pulse 83, respirations 16, and O2 sats 99 on room air. MUSCULOSKELETAL:  Motor strength and sensation are unchanged. CONSTITUTIONAL:  She is within normal limits.  She is alert and oriented x3.  She has a slight limp.  We talked about referral to Dr. Delma Officer, in fact she has no insurance, holding of her being seen by any neurosurgeon in the area, hopefully Dr. Pamelia Hoit will help some kind of recommendations for her at the next appointment.  ASSESSMENT: 1. History of left lateral epicondylitis. 2. Low back pain, degenerative disk disease. 3. Chronic pain.  PLAN:  Refill Norco 5/325 one p.o. b.i.d., 60 with no refills.  Her questions were encouraged and answered.  She will see Dr. Pamelia Hoit in a month.     Latoya Collier L. Blima Dessert     Latoya Collier, M.D. Electronically Signed   RLW/MedQ D:  05/07/2011 14:56:29  T:  05/08/2011 01:31:02  Job #:  [id number]

## 2011-06-04 ENCOUNTER — Ambulatory Visit: Payer: Self-pay | Admitting: Neurosurgery

## 2011-06-06 ENCOUNTER — Encounter: Payer: Self-pay | Admitting: Physical Medicine and Rehabilitation

## 2011-06-07 ENCOUNTER — Encounter: Payer: Self-pay | Attending: Neurosurgery | Admitting: Neurosurgery

## 2011-06-07 DIAGNOSIS — M545 Low back pain, unspecified: Secondary | ICD-10-CM | POA: Insufficient documentation

## 2011-06-07 DIAGNOSIS — M5137 Other intervertebral disc degeneration, lumbosacral region: Secondary | ICD-10-CM

## 2011-06-07 DIAGNOSIS — M51379 Other intervertebral disc degeneration, lumbosacral region without mention of lumbar back pain or lower extremity pain: Secondary | ICD-10-CM | POA: Insufficient documentation

## 2011-06-07 DIAGNOSIS — G8929 Other chronic pain: Secondary | ICD-10-CM | POA: Insufficient documentation

## 2011-06-07 DIAGNOSIS — G894 Chronic pain syndrome: Secondary | ICD-10-CM

## 2011-06-07 DIAGNOSIS — M7710 Lateral epicondylitis, unspecified elbow: Secondary | ICD-10-CM

## 2011-06-07 NOTE — Assessment & Plan Note (Signed)
This is a patient of Dr. Leretha Dykes, seen for low back pain.  She was scheduled to see Dr. Pamelia Hoit yesterday, but had court and had to change her appointment.  She will follow up with her in a month.  She rates her average pain at 8.  It is a sharp, stabbing, aching pain. General activity level is 7.  Pain is worse in the morning, during the day.  Walking, bending, sitting, standing aggravate.  Medication does help some.  She can walk up to 30 minutes at a time.  She does drive. Functionally, she is unemployed.  REVIEW OF SYSTEMS:  Notable for difficulties described above, otherwise within normal limits.  PAST MEDICAL HISTORY:  Unchanged.  SOCIAL HISTORY:  Unchanged.  FAMILY HISTORY:  Unchanged.  PHYSICAL EXAMINATION:  VITAL SIGNS:  Her blood pressure is 136/70, pulse 79, respirations 18, O2 sat is 96 on room air. NEUROLOGIC:  Her motor strength and sensation intact in upper and lower extremities. CONSTITUTIONAL:  She is within normal limits.  She is alert and oriented x3.  She does walk with a significant limp, has trouble rising from a seated position.  She has still not found a neurosurgeon that will see her.  We have made referrals to [facility] and [facility].  ASSESSMENT: 1. History of left lateral epicondylitis, no problem now. 2. Low back pain, degenerative disk disease. 3. Chronic pain.  PLAN:  Refill Norco 5/325 one p.o. b.i.d., 60 with no refill.  Her questions were encouraged and answered.  Dr. Pamelia Hoit will see her in a month.     Latoya Collier L. Blima Dessert Electronically Signed    RLW/MedQ D:  06/07/2011 11:38:33  T:  06/07/2011 21:07:19  Job #:  [id number]

## 2011-07-05 ENCOUNTER — Encounter: Payer: Self-pay | Attending: Neurosurgery | Admitting: Neurosurgery

## 2011-07-05 DIAGNOSIS — M545 Low back pain, unspecified: Secondary | ICD-10-CM | POA: Insufficient documentation

## 2011-07-05 DIAGNOSIS — M5137 Other intervertebral disc degeneration, lumbosacral region: Secondary | ICD-10-CM | POA: Insufficient documentation

## 2011-07-05 DIAGNOSIS — G894 Chronic pain syndrome: Secondary | ICD-10-CM

## 2011-07-05 DIAGNOSIS — G8929 Other chronic pain: Secondary | ICD-10-CM | POA: Insufficient documentation

## 2011-07-05 DIAGNOSIS — M51379 Other intervertebral disc degeneration, lumbosacral region without mention of lumbar back pain or lower extremity pain: Secondary | ICD-10-CM | POA: Insufficient documentation

## 2011-07-05 DIAGNOSIS — M7710 Lateral epicondylitis, unspecified elbow: Secondary | ICD-10-CM

## 2011-07-06 NOTE — Assessment & Plan Note (Signed)
This is a patient of Dr. Pamelia Hoit, seen for right-sided low back pain. She reports no change in her pain at an 8.  It is a sharp to stabbing, aching-type pain.  General activity level is 7 pain is same 24 hours a day.  Sleep patterns are fair.  All activities aggravate.  Rest and medication tends to help.  She walks with and without assistance.  She can walk about 20 minutes at a time.  She does drive.  Functionally, she is unemployed.  REVIEW OF SYSTEMS:  Notable for difficulties described above, otherwise unremarkable.  PAST MEDICAL HISTORY:  Unchanged.  SOCIAL HISTORY:  Unchanged.  FAMILY HISTORY:  Unchanged.  PHYSICAL EXAMINATION:  Blood pressure is 122/72, pulse 78, respirations 16, O2 sats 97 on room air.  Her motor strength and sensation are intact.  Constitutionally, within normal limits.  She is alert and oriented x3.  She has a slight limp.  ASSESSMENT: 1. History of left lateral epicondylitis, resolved. 2. Chronic low back pain, degenerative disk disease. 3. Chronic pain syndrome.  PLAN:  Refill Norco 5/325, 1 p.o. b.i.d., 60 with no refill.  She was warned against short pill count.  She is 2 days short today.  She states she had taken a half nature over time, that probably end up other 4 pills she is short.  I explained to her that is not acceptable.  She will speak with Dr. Pamelia Hoit next month.  Her appointment is already scheduled.  Her questions were encouraged and answered.     Bryer Gottsch L. Blima Dessert Electronically Signed    RLW/MedQ D:  07/05/2011 11:16:26  T:  07/06/2011 04:17:08  Job #:  [id number]

## 2011-08-02 ENCOUNTER — Other Ambulatory Visit: Payer: Self-pay | Admitting: Family Medicine

## 2011-08-06 ENCOUNTER — Encounter: Payer: Self-pay | Admitting: Physical Medicine and Rehabilitation

## 2011-09-04 ENCOUNTER — Ambulatory Visit (HOSPITAL_COMMUNITY): Payer: Self-pay

## 2011-09-14 ENCOUNTER — Encounter: Payer: Self-pay | Admitting: *Deleted

## 2011-09-14 ENCOUNTER — Encounter: Payer: Self-pay | Attending: Physical Medicine and Rehabilitation | Admitting: *Deleted

## 2011-09-14 VITALS — BP 114/59 | HR 87 | Resp 18 | Ht 65.0 in | Wt 174.0 lb

## 2011-09-14 DIAGNOSIS — M545 Low back pain, unspecified: Secondary | ICD-10-CM | POA: Insufficient documentation

## 2011-09-14 DIAGNOSIS — M51379 Other intervertebral disc degeneration, lumbosacral region without mention of lumbar back pain or lower extremity pain: Secondary | ICD-10-CM | POA: Insufficient documentation

## 2011-09-14 DIAGNOSIS — G8929 Other chronic pain: Secondary | ICD-10-CM | POA: Insufficient documentation

## 2011-09-14 DIAGNOSIS — M5430 Sciatica, unspecified side: Secondary | ICD-10-CM

## 2011-09-14 DIAGNOSIS — G894 Chronic pain syndrome: Secondary | ICD-10-CM | POA: Insufficient documentation

## 2011-09-14 DIAGNOSIS — M5137 Other intervertebral disc degeneration, lumbosacral region: Secondary | ICD-10-CM | POA: Insufficient documentation

## 2011-09-14 MED ORDER — HYDROCODONE-ACETAMINOPHEN 5-325 MG PO TABS: 1.0000 | ORAL_TABLET | Freq: Two times a day (BID) | ORAL | Status: DC

## 2011-09-14 NOTE — Progress Notes (Signed)
Recent flu caused app't rescheduling. States current pain med gives fair relief but 2 per day not enough for her to be active. She was denied disability so will have to find a job. Her current indigent status preventing acceptance by any neurosurgeons in area.

## 2011-09-17 ENCOUNTER — Ambulatory Visit (HOSPITAL_COMMUNITY): Payer: Self-pay

## 2011-09-20 ENCOUNTER — Encounter: Payer: Self-pay | Admitting: Physical Medicine and Rehabilitation

## 2011-10-10 ENCOUNTER — Encounter: Payer: Self-pay | Admitting: Physical Medicine and Rehabilitation

## 2011-10-16 ENCOUNTER — Encounter: Payer: Self-pay | Attending: Physical Medicine and Rehabilitation | Admitting: *Deleted

## 2011-10-16 ENCOUNTER — Encounter: Payer: Self-pay | Admitting: *Deleted

## 2011-10-16 VITALS — BP 132/80 | HR 87 | Resp 18 | Ht 65.0 in | Wt 177.0 lb

## 2011-10-16 DIAGNOSIS — G8929 Other chronic pain: Secondary | ICD-10-CM | POA: Insufficient documentation

## 2011-10-16 DIAGNOSIS — G894 Chronic pain syndrome: Secondary | ICD-10-CM

## 2011-10-16 DIAGNOSIS — M5430 Sciatica, unspecified side: Secondary | ICD-10-CM

## 2011-10-16 DIAGNOSIS — M545 Low back pain, unspecified: Secondary | ICD-10-CM

## 2011-10-16 MED ORDER — HYDROCODONE-ACETAMINOPHEN 5-325 MG PO TABS: 1.0000 | ORAL_TABLET | Freq: Two times a day (BID) | ORAL | Status: DC

## 2011-10-16 NOTE — Progress Notes (Signed)
Reports no change in pain complaint. Discussed increase in pain med dose or frequency; advised to discuss with MD at next visit. Thinking about taking some classes in the fall, not sure in what field. Still desires neurosurgeon consult but cannot get appointment due to finances.

## 2011-11-14 ENCOUNTER — Encounter: Payer: Self-pay | Admitting: Physical Medicine and Rehabilitation

## 2011-11-16 ENCOUNTER — Encounter: Payer: Self-pay | Attending: Physical Medicine and Rehabilitation | Admitting: Physical Medicine and Rehabilitation

## 2011-11-16 ENCOUNTER — Encounter: Payer: Self-pay | Admitting: Physical Medicine and Rehabilitation

## 2011-11-16 VITALS — BP 126/80 | HR 89 | Resp 16 | Ht 65.0 in | Wt 178.0 lb

## 2011-11-16 DIAGNOSIS — G8929 Other chronic pain: Secondary | ICD-10-CM | POA: Insufficient documentation

## 2011-11-16 DIAGNOSIS — M47816 Spondylosis without myelopathy or radiculopathy, lumbar region: Secondary | ICD-10-CM | POA: Insufficient documentation

## 2011-11-16 DIAGNOSIS — M545 Low back pain, unspecified: Secondary | ICD-10-CM | POA: Insufficient documentation

## 2011-11-16 DIAGNOSIS — M51379 Other intervertebral disc degeneration, lumbosacral region without mention of lumbar back pain or lower extremity pain: Secondary | ICD-10-CM | POA: Insufficient documentation

## 2011-11-16 DIAGNOSIS — M47817 Spondylosis without myelopathy or radiculopathy, lumbosacral region: Secondary | ICD-10-CM

## 2011-11-16 DIAGNOSIS — G894 Chronic pain syndrome: Secondary | ICD-10-CM | POA: Insufficient documentation

## 2011-11-16 DIAGNOSIS — M5137 Other intervertebral disc degeneration, lumbosacral region: Secondary | ICD-10-CM | POA: Insufficient documentation

## 2011-11-16 MED ORDER — HYDROCODONE-ACETAMINOPHEN 5-325 MG PO TABS: 1.0000 | ORAL_TABLET | Freq: Two times a day (BID) | ORAL | Status: DC

## 2011-11-16 NOTE — Patient Instructions (Signed)
Don't lift both legs at the same time when you do your core exercises.  Consider pool therapy at local pool.  Keep your pain medications locked up in a secure location.  F/u in one month.

## 2011-11-16 NOTE — Progress Notes (Signed)
Subjective:    Patient ID: Latoya Collier, female    DOB: 03/24/1961, 51 y.o.   MRN: [id number]  HPI  The patient is a 51 year old woman who has been seen at the center for pain and rehabilitation almost a year or 2 year and a half. She has predominantly right sided low back pain. The pain is worse with bending, prolonged standing or sitting. Stairclimbing aggravates. With these activities pain can go up to a 10 on a scale of 10. With medications she can get to a 6-7 on a scale of 10. She's never really pain free.  She reports no leg pain. No problems controlling bladder or bowel he had no problems with numbness or tingling in.  She had stretches daily and walks on a daily basis.  She is interested in summer school.   Still doing core exercises.        Pain Inventory Average Pain 8 Pain Right Now 8 My pain is sharp, dull, stabbing and aching  In the last 24 hours, has pain interfered with the following? General activity 7 Relation with others 7 Enjoyment of life 7 What TIME of day is your pain at its worst? All Day Sleep (in general) Fair  Pain is worse with: walking, bending, sitting, standing and some activites Pain improves with: rest and medication Relief from Meds: 7  Mobility ability to climb steps?  yes do you drive?  no  Function Do you have any goals in this area?  no  Neuro/Psych No problems in this area  Prior Studies Any changes since last visit?  no  Physicians involved in your care Any changes since last visit?  no  Review of Systems  Constitutional: Negative.   HENT: Negative.   Eyes: Negative.   Respiratory: Negative.   Cardiovascular: Negative.   Gastrointestinal: Negative.   Genitourinary: Negative.   Musculoskeletal: Negative.   Skin: Negative.   Neurological: Negative.   Hematological: Negative.   Psychiatric/Behavioral: Negative.        Objective:   Physical Exam  GENERAL: She is a well-developed, well-nourished woman who  does not  appear in any distress. She is oriented x3. Speech is clear. Affect  is bright. She is alert, cooperative, and pleasant. Follows commands  without difficulty, answers my questions appropriately. Cranial nerves  and coordination are intact.  MUSCULOSKELETAL: Her reflexes are 2+ at biceps, triceps,  brachioradialis, 2+ at the patellar tendons as well as Achilles tendon.  No abnormal tone, clonus, or tremors are noted.  No sensory deficits are noted in upper and lower extremities. Motor  strength is good in both upper and lower extremities. She transitions  easily from sitting to standing. Gait is non-antalgic. Tandem gait.  Romberg test performed adequately. She complains of pain with forward  flexion. She has some discomfort with lumbar extension combined with rotation.   *RADIOLOGY REPORT*  Clinical Data: Low back pain for 3-4 years.  MRI LUMBAR SPINE WITHOUT CONTRAST  Technique: Multiplanar and multiecho pulse sequences of the lumbar  spine were obtained without intravenous contrast.  Comparison: None.  Findings: The lowest full intervertebral disk space is labeled L5-  S1. If procedural intervention is to be performed, careful  correlation with this numbering strategy is recommended.  The conus medullaris appears unremarkable. Conus level: L1-2.  Schmorl's nodes and a type 1 and type 2 degenerative endplate  findings noted at L3-4.  Type 1 degenerative endplate findings noted at L5-S1. There is  also left facet edema at  L5-S1 which is likely degenerative.  Minimal levoconvex lumbar scoliosis noted.  Several cystic lesions the right kidney are partially characterized  on today's exam.  Additional findings at individual levels are as follows:  L2-3: Mild bilateral facet arthropathy noted without impingement.  L3-4: Disc osteophyte complex eccentric to the right causes  moderate right foraminal stenosis and abuts the right L3 nerve in  the lateral extraforaminal space.  There is mild right subarticular  lateral recess stenosis. The AP diameter of the thecal sac is 10  mm, borderline for central stenosis.  L4-5: Bilateral facet arthropathy noted with diffuse disc bulge.  The disc bulge mildly abuts the right L4 nerve in the lateral  extraforaminal space and there is borderline bilateral subarticular  lateral recess stenosis.  L5-S1: Left greater than right facet arthropathy noted along with  disc osteophyte complex causing moderate left foraminal stenosis.        Assessment & Plan:     1. Lumbar spondylosis and degenerative disc disease 2. Chronic low back pain predominantly right-sided worse with flexion, some discomfort with extension and prolonged walking  Refill pain medications Vicodin 5/325 not more than 2 per day.  She is also using tramadol her her primary care up to 3 a day.    Recommend pool therapy this was discussed with her. Modifications are made to her current home exercise program as well.  Encourage weight loss.  Minimize narcotic pain medication and encourage function. Continue to monitor pill counts and random urine drug screen's. She takes her medications as prescribed no aberranent behavior noted.  Last urine drug screen negative. Don't lift both legs at the same time when you do your core exercises.  Home exercise program was reviewed and 25 minutes was spent going over this with her.

## 2011-12-14 ENCOUNTER — Ambulatory Visit: Payer: Self-pay | Admitting: Physical Medicine and Rehabilitation

## 2011-12-17 ENCOUNTER — Encounter: Payer: Self-pay | Attending: Physical Medicine and Rehabilitation | Admitting: Physical Medicine and Rehabilitation

## 2011-12-17 ENCOUNTER — Encounter: Payer: Self-pay | Admitting: Physical Medicine and Rehabilitation

## 2011-12-17 VITALS — BP 130/78 | HR 80 | Resp 14 | Ht 65.0 in | Wt 177.0 lb

## 2011-12-17 DIAGNOSIS — M51379 Other intervertebral disc degeneration, lumbosacral region without mention of lumbar back pain or lower extremity pain: Secondary | ICD-10-CM | POA: Insufficient documentation

## 2011-12-17 DIAGNOSIS — M545 Low back pain, unspecified: Secondary | ICD-10-CM | POA: Insufficient documentation

## 2011-12-17 DIAGNOSIS — M47816 Spondylosis without myelopathy or radiculopathy, lumbar region: Secondary | ICD-10-CM

## 2011-12-17 DIAGNOSIS — F17200 Nicotine dependence, unspecified, uncomplicated: Secondary | ICD-10-CM | POA: Insufficient documentation

## 2011-12-17 DIAGNOSIS — M5137 Other intervertebral disc degeneration, lumbosacral region: Secondary | ICD-10-CM | POA: Insufficient documentation

## 2011-12-17 DIAGNOSIS — M47817 Spondylosis without myelopathy or radiculopathy, lumbosacral region: Secondary | ICD-10-CM | POA: Insufficient documentation

## 2011-12-17 DIAGNOSIS — G8929 Other chronic pain: Secondary | ICD-10-CM | POA: Insufficient documentation

## 2011-12-17 MED ORDER — HYDROCODONE-ACETAMINOPHEN 5-325 MG PO TABS: 1.0000 | ORAL_TABLET | Freq: Two times a day (BID) | ORAL | Status: DC

## 2011-12-17 NOTE — Progress Notes (Signed)
Subjective:    Patient ID: Latoya Collier, female    DOB: 24-Oct-1960, 51 y.o.   MRN: [id number]  HPI The patient complains about chronic low back  pain which radiates into right side of her back.  The problem has been stable, she only complains about some exacerbation, after a fall,  and because of the weather. Pain Inventory Average Pain 8 Pain Right Now 8 My pain is sharp, dull, stabbing and aching  In the last 24 hours, has pain interfered with the following? General activity 7 Relation with others 7 Enjoyment of life 7 What TIME of day is your pain at its worst? daytime Sleep (in general) Fair  Pain is worse with: walking, bending, standing and some activites Pain improves with: medication Relief from Meds: 5  Mobility walk without assistance how many minutes can you walk? 5 ability to climb steps?  yes do you drive?  no  Function Do you have any goals in this area?  no  Neuro/Psych No problems in this area  Prior Studies Any changes since last visit?  no  Physicians involved in your care Any changes since last visit?  no   History reviewed. No pertinent family history. History   Social History  . Marital Status: Divorced    Spouse Name: N/A    Number of Children: N/A  . Years of Education: N/A   Social History Main Topics  . Smoking status: Current Everyday Smoker -- 0.3 packs/day for 30 years  . Smokeless tobacco: None   Comment: cutting back  . Alcohol Use: No     occ glass of wine  . Drug Use: No  . Sexually Active: None   Other Topics Concern  . None   Social History Narrative  . None   Past Surgical History  Procedure Date  . Colonoscopy   . Diagnostic mammogram    Past Medical History  Diagnosis Date  . Arthritis   . Abnormal cells of cervix    BP 130/78  Pulse 80  Resp 14  Ht 5\' 5"  (1.651 m)  Wt 177 lb (80.287 kg)  BMI 29.45 kg/m2  SpO2 95%      Review of Systems  Constitutional: Negative.   HENT: Negative.     Eyes: Negative.   Respiratory: Negative.   Cardiovascular: Negative.   Gastrointestinal: Negative.   Genitourinary: Negative.   Musculoskeletal: Positive for back pain.  Skin: Negative.   Neurological: Negative.   Hematological: Negative.   Psychiatric/Behavioral: Negative.        Objective:   Physical Exam  Constitutional: She is oriented to person, place, and time. She appears well-developed and well-nourished.  HENT:  Head: Normocephalic.  Neck: Neck supple.  Musculoskeletal: She exhibits tenderness.  Neurological: She is alert and oriented to person, place, and time.  Skin: Skin is warm and dry.  Psychiatric: She has a normal mood and affect.    Symmetric normal motor tone is noted throughout. Normal muscle bulk. Muscle testing reveals 5/5 muscle strength of the upper extremity, and 5/5 of the lower extremity. Full range of motion in upper and lower extremities. ROM of spine is not restricted. Fine motor movements are normal in both ha DTR in the upper and lower extremity are present and symmetric 2+. No clonus is noted.  Patient arises from chair without difficulty. Narrow based gait with normal arm swing bilateral , able to walk on heels and toes . Tandem walk is stable. No pronator drift. Rhomberg negative. Tenderness  in the lower back on the right side.      Assessment & Plan:  1. Lumbar spondylosis and degenerative disc disease  2. Chronic low back pain predominantly right-sided worse with flexion, some discomfort with extension and prolonged walking  Refill pain medications Vicodin 5/325 not more than 2 per day.  She is also using tramadol her her primary care up to 3 a day.  Recommend pool therapy this was discussed with her. Modifications are made to her current home exercise program as well. The patient had some exacerbation, because of the weather and a recent fall , advised patient to take ibuprofen 500 mg 3 times a day for a month. Might also consider medial  branch block on the right L 3-4, and L4-5. Encourage weight loss.

## 2011-12-17 NOTE — Patient Instructions (Signed)
Continue with core exercises and walking, continue with medications.

## 2012-01-16 ENCOUNTER — Encounter: Payer: Self-pay | Admitting: Physical Medicine and Rehabilitation

## 2012-01-17 ENCOUNTER — Telehealth: Payer: Self-pay | Admitting: Physical Medicine and Rehabilitation

## 2012-01-17 MED ORDER — HYDROCODONE-ACETAMINOPHEN 5-325 MG PO TABS: 1.0000 | ORAL_TABLET | Freq: Two times a day (BID) | ORAL | Status: DC

## 2012-01-17 NOTE — Telephone Encounter (Signed)
Needs medication refill (unable to reach to find out which med).  Also, has a question about a new medication that PCP gave her.

## 2012-01-17 NOTE — Telephone Encounter (Signed)
Needs Hydrocodone refill.  PCP rx Klonopin.

## 2012-01-17 NOTE — Telephone Encounter (Signed)
Left message on personally identified voicemail for Latoya Collier to call us back and identify the medication she is requesting and the medication she has a question about.

## 2012-01-17 NOTE — Telephone Encounter (Signed)
Ms Repsher notified that her medication had been refilled.

## 2012-01-24 ENCOUNTER — Encounter: Payer: Self-pay | Attending: Physical Medicine and Rehabilitation | Admitting: Physical Medicine and Rehabilitation

## 2012-01-24 ENCOUNTER — Encounter: Payer: Self-pay | Admitting: Physical Medicine and Rehabilitation

## 2012-01-24 VITALS — Resp 14 | Ht 65.0 in | Wt 180.0 lb

## 2012-01-24 DIAGNOSIS — M47816 Spondylosis without myelopathy or radiculopathy, lumbar region: Secondary | ICD-10-CM

## 2012-01-24 DIAGNOSIS — M545 Low back pain, unspecified: Secondary | ICD-10-CM | POA: Insufficient documentation

## 2012-01-24 DIAGNOSIS — M51379 Other intervertebral disc degeneration, lumbosacral region without mention of lumbar back pain or lower extremity pain: Secondary | ICD-10-CM | POA: Insufficient documentation

## 2012-01-24 DIAGNOSIS — M47817 Spondylosis without myelopathy or radiculopathy, lumbosacral region: Secondary | ICD-10-CM | POA: Insufficient documentation

## 2012-01-24 DIAGNOSIS — M5137 Other intervertebral disc degeneration, lumbosacral region: Secondary | ICD-10-CM | POA: Insufficient documentation

## 2012-01-24 DIAGNOSIS — F17200 Nicotine dependence, unspecified, uncomplicated: Secondary | ICD-10-CM | POA: Insufficient documentation

## 2012-01-24 DIAGNOSIS — G8929 Other chronic pain: Secondary | ICD-10-CM | POA: Insufficient documentation

## 2012-01-24 MED ORDER — MELOXICAM 15 MG PO TABS: 15.0000 mg | ORAL_TABLET | Freq: Every day | ORAL | Status: DC

## 2012-01-24 NOTE — Progress Notes (Signed)
Subjective:    Patient ID: Latoya Collier, female    DOB: [birth year], 51 y.o.   MRN: [id number]  HPI The patient complains about chronic low back pain which radiates into right side of her back.  The problem has been stable .  Pain Inventory Average Pain 8 Pain Right Now 8 My pain is constant, sharp, dull, stabbing and aching  In the last 24 hours, has pain interfered with the following? General activity 7 Relation with others 7 Enjoyment of life 7 What TIME of day is your pain at its worst? morning, day and evening Sleep (in general) Fair  Pain is worse with: walking, bending, sitting, inactivity, standing and some activites Pain improves with: medication Relief from Meds: 5  Mobility walk without assistance walk with assistance use a cane use a walker how many minutes can you walk? 5-10 ability to climb steps?  no do you drive?  no  Function employed # of hrs/week students  Neuro/Psych No problems in this area  Prior Studies Any changes since last visit?  no  Physicians involved in your care Any changes since last visit?  no   History reviewed. No pertinent family history. History   Social History  . Marital Status: Divorced    Spouse Name: N/A    Number of Children: N/A  . Years of Education: N/A   Social History Main Topics  . Smoking status: Current Everyday Smoker -- 0.3 packs/day for 30 years  . Smokeless tobacco: None   Comment: cutting back  . Alcohol Use: No     occ glass of wine  . Drug Use: No  . Sexually Active: None   Other Topics Concern  . None   Social History Narrative  . None   Past Surgical History  Procedure Date  . Colonoscopy   . Diagnostic mammogram    Past Medical History  Diagnosis Date  . Arthritis   . Abnormal cells of cervix    Resp 14  Ht 5\' 5"  (1.651 m)  Wt 180 lb (81.647 kg)  BMI 29.95 kg/m2  SpO2 99%     Review of Systems  Musculoskeletal: Positive for back pain.  All other systems reviewed  and are negative.       Objective:   Physical Exam Constitutional: She is oriented to person, place, and time. She appears well-developed and well-nourished.  HENT:  Head: Normocephalic.  Neck: Neck supple.  Musculoskeletal: She exhibits tenderness.  Neurological: She is alert and oriented to person, place, and time.  Skin: Skin is warm and dry.  Psychiatric: She has a normal mood and affect.   Symmetric normal motor tone is noted throughout. Normal muscle bulk. Muscle testing reveals 5/5 muscle strength of the upper extremity, and 5/5 of the lower extremity. Full range of motion in upper and lower extremities. ROM of spine is not restricted. Fine motor movements are normal in both ha  DTR in the upper and lower extremity are present and symmetric 2+. No clonus is noted.  Patient arises from chair without difficulty. Narrow based gait with normal arm swing bilateral , able to walk on heels and toes . Tandem walk is stable. No pronator drift. Rhomberg negative.  Tenderness in the lower back on the right side.        Assessment & Plan:  1. Lumbar spondylosis and degenerative disc disease  2. Chronic low back pain predominantly right-sided worse with flexion, some discomfort with extension and prolonged walking  Refill pain medications  Vicodin 5/325 not more than 2 per day.  She is also using tramadol her her primary care up to 3 a day.  Recommend pool therapy this was discussed with her. Modifications are made to her current home exercise program as well. The patient had some exacerbation, prescribed Mobic, advised patient to not take any other anti-inflammatories with the Mobic. Might also consider medial branch block on the right L 3-4, and L4-5.  Encourage weight loss.

## 2012-01-24 NOTE — Patient Instructions (Signed)
Continue with exercising and walking. 

## 2012-02-13 ENCOUNTER — Telehealth: Payer: Self-pay | Admitting: Physical Medicine and Rehabilitation

## 2012-02-13 MED ORDER — HYDROCODONE-ACETAMINOPHEN 5-325 MG PO TABS: 1.0000 | ORAL_TABLET | Freq: Two times a day (BID) | ORAL | Status: DC

## 2012-02-13 NOTE — Telephone Encounter (Signed)
Rx for Hydrocodone has been called in, pt aware.

## 2012-02-13 NOTE — Telephone Encounter (Signed)
Refill to [facility]

## 2012-02-21 ENCOUNTER — Encounter: Payer: Self-pay | Admitting: Physical Medicine and Rehabilitation

## 2012-03-17 ENCOUNTER — Encounter: Payer: Self-pay | Admitting: Physical Medicine and Rehabilitation

## 2012-03-18 ENCOUNTER — Other Ambulatory Visit: Payer: Self-pay | Admitting: Physical Medicine and Rehabilitation

## 2012-03-18 ENCOUNTER — Telehealth: Payer: Self-pay

## 2012-03-18 MED ORDER — HYDROCODONE-ACETAMINOPHEN 5-325 MG PO TABS: 1.0000 | ORAL_TABLET | Freq: Two times a day (BID) | ORAL | Status: DC

## 2012-03-18 NOTE — Telephone Encounter (Signed)
Left VM notifying Ms Vari we refilled her medicaton.

## 2012-03-18 NOTE — Telephone Encounter (Signed)
Patient request refill on medication but did not say which one.

## 2012-04-22 ENCOUNTER — Telehealth: Payer: Self-pay | Admitting: Physical Medicine and Rehabilitation

## 2012-04-22 ENCOUNTER — Other Ambulatory Visit: Payer: Self-pay | Admitting: Physical Medicine and Rehabilitation

## 2012-04-22 MED ORDER — HYDROCODONE-ACETAMINOPHEN 5-325 MG PO TABS: 1.0000 | ORAL_TABLET | Freq: Two times a day (BID) | ORAL | Status: DC

## 2012-04-22 NOTE — Telephone Encounter (Signed)
Refill on Hydrocodone.  Also, needs refill on Clonazepam and Tramadol which she was getting from [facility]

## 2012-04-22 NOTE — Telephone Encounter (Signed)
I have refilled Hydrocodone. Pt is aware of this and that we will have to talk to Dr. Pamelia Hoit regarding the other 2 prescriptions.

## 2012-04-29 ENCOUNTER — Telehealth: Payer: Self-pay | Admitting: Physical Medicine and Rehabilitation

## 2012-04-29 NOTE — Telephone Encounter (Signed)
Following up on refills of Tramadol and Clonazepam which she was getting from [facility].

## 2012-04-30 NOTE — Telephone Encounter (Signed)
Dr Pamelia Hoit, Danah had called on the 15th for refill on hydrocodone and asked about her clonazepam and Tramadol which she was getting from [facility]. She talked to [place] and I don't see where she sent you a message about it. If this is a repeat, then disregard. Do you want to fill or advise?

## 2012-05-07 ENCOUNTER — Telehealth: Payer: Self-pay | Admitting: Physical Medicine and Rehabilitation

## 2012-05-07 MED ORDER — TRAMADOL HCL 50 MG PO TABS: 50.0000 mg | ORAL_TABLET | Freq: Three times a day (TID) | ORAL | Status: DC

## 2012-05-07 MED ORDER — CLONAZEPAM 0.5 MG PO TABS: 0.5000 mg | ORAL_TABLET | Freq: Two times a day (BID) | ORAL | Status: DC | PRN

## 2012-05-07 MED ORDER — CYCLOBENZAPRINE HCL 10 MG PO TABS: 10.0000 mg | ORAL_TABLET | ORAL | Status: DC | PRN

## 2012-05-07 NOTE — Telephone Encounter (Signed)
Lm advising patient to call back with what medications she is needing, or she can have the pharmacy request them.

## 2012-05-07 NOTE — Telephone Encounter (Signed)
Patient needs tramadol 50mg  tid, flexeril 10mg  qd, and clonazepam 0.5mg  bid.  Called in.  Pt aware.

## 2012-05-07 NOTE — Telephone Encounter (Signed)
Following up on refill request from 2 weeks ago, spoke to [facility].

## 2012-05-21 ENCOUNTER — Other Ambulatory Visit: Payer: Self-pay | Admitting: Physical Medicine and Rehabilitation

## 2012-05-23 ENCOUNTER — Telehealth: Payer: Self-pay

## 2012-05-23 MED ORDER — HYDROCODONE-ACETAMINOPHEN 5-325 MG PO TABS: 1.0000 | ORAL_TABLET | Freq: Two times a day (BID) | ORAL | Status: DC

## 2012-05-23 NOTE — Telephone Encounter (Signed)
Patient called needing refill on hydrocodone called to [facility].  Patient will schedule appt.

## 2012-06-18 ENCOUNTER — Encounter: Payer: Self-pay | Attending: Physical Medicine and Rehabilitation | Admitting: Physical Medicine and Rehabilitation

## 2012-06-18 ENCOUNTER — Encounter: Payer: Self-pay | Admitting: Physical Medicine and Rehabilitation

## 2012-06-18 VITALS — BP 127/54 | HR 84 | Resp 14 | Ht 65.0 in | Wt 180.0 lb

## 2012-06-18 DIAGNOSIS — G8929 Other chronic pain: Secondary | ICD-10-CM | POA: Insufficient documentation

## 2012-06-18 DIAGNOSIS — M545 Low back pain, unspecified: Secondary | ICD-10-CM | POA: Insufficient documentation

## 2012-06-18 DIAGNOSIS — M51379 Other intervertebral disc degeneration, lumbosacral region without mention of lumbar back pain or lower extremity pain: Secondary | ICD-10-CM | POA: Insufficient documentation

## 2012-06-18 DIAGNOSIS — M47817 Spondylosis without myelopathy or radiculopathy, lumbosacral region: Secondary | ICD-10-CM | POA: Insufficient documentation

## 2012-06-18 DIAGNOSIS — M47816 Spondylosis without myelopathy or radiculopathy, lumbar region: Secondary | ICD-10-CM

## 2012-06-18 DIAGNOSIS — M5137 Other intervertebral disc degeneration, lumbosacral region: Secondary | ICD-10-CM | POA: Insufficient documentation

## 2012-06-18 DIAGNOSIS — F17200 Nicotine dependence, unspecified, uncomplicated: Secondary | ICD-10-CM | POA: Insufficient documentation

## 2012-06-18 DIAGNOSIS — Z5181 Encounter for therapeutic drug level monitoring: Secondary | ICD-10-CM

## 2012-06-18 MED ORDER — HYDROCODONE-ACETAMINOPHEN 5-325 MG PO TABS: 1.0000 | ORAL_TABLET | Freq: Two times a day (BID) | ORAL | Status: DC

## 2012-06-18 MED ORDER — MELOXICAM 15 MG PO TABS: 15.0000 mg | ORAL_TABLET | Freq: Every day | ORAL | Status: DC

## 2012-06-18 MED ORDER — DULOXETINE HCL 30 MG PO CPEP: 30.0000 mg | ORAL_CAPSULE | Freq: Two times a day (BID) | ORAL | Status: DC

## 2012-06-18 NOTE — Patient Instructions (Signed)
Continue with your walking and stretching exercises.

## 2012-06-18 NOTE — Progress Notes (Signed)
Subjective:    Patient ID: Latoya Collier, female    DOB: [birth year], 51 y.o.   MRN: [id number]  HPI The patient complains about chronic low back pain which radiates into right side of her back. Prolonged standing or sitting aggrevates her pain, lying on her back alleviates her pain. The problem has been stable .   Pain Inventory Average Pain 7 Pain Right Now 7 My pain is constant, sharp, dull and stabbing  In the last 24 hours, has pain interfered with the following? General activity 5 Relation with others 5 Enjoyment of life 5 What TIME of day is your pain at its worst? all of the time Sleep (in general) Fair  Pain is worse with: walking, bending and standing Pain improves with: medication Relief from Meds: 6  Mobility walk without assistance how many minutes can you walk? 10 ability to climb steps?  yes do you drive?  yes  Function not employed: date last employed   Neuro/Psych trouble walking depression anxiety  Prior Studies Any changes since last visit?  no  Physicians involved in your care Any changes since last visit?  no   History reviewed. No pertinent family history. History   Social History  . Marital Status: Divorced    Spouse Name: N/A    Number of Children: N/A  . Years of Education: N/A   Social History Main Topics  . Smoking status: Current Every Day Smoker -- 0.3 packs/day for 30 years  . Smokeless tobacco: Never Used     Comment: cutting back  . Alcohol Use: No     Comment: occ glass of wine  . Drug Use: No  . Sexually Active: None   Other Topics Concern  . None   Social History Narrative  . None   Past Surgical History  Procedure Date  . Colonoscopy   . Diagnostic mammogram    Past Medical History  Diagnosis Date  . Arthritis   . Abnormal cells of cervix    BP 127/54  Pulse 84  Resp 14  Ht 5\' 5"  (1.651 m)  Wt 180 lb (81.647 kg)  BMI 29.95 kg/m2  SpO2 100%   Review of Systems  Musculoskeletal: Positive for  gait problem.  Psychiatric/Behavioral: Positive for dysphoric mood. The patient is nervous/anxious.   All other systems reviewed and are negative.       Objective:   Physical Exam Constitutional: She is oriented to person, place, and time. She appears well-developed and well-nourished.  HENT:  Head: Normocephalic.  Neck: Neck supple.  Musculoskeletal: She exhibits tenderness.  Neurological: She is alert and oriented to person, place, and time.  Skin: Skin is warm and dry.  Psychiatric: She has a normal mood and affect.  Symmetric normal motor tone is noted throughout. Normal muscle bulk. Muscle testing reveals 5/5 muscle strength of the upper extremity, and 5/5 of the lower extremity. Full range of motion in upper and lower extremities. ROM of spine is restricted, because of pain. Fine motor movements are normal in both hands.  DTR in the upper and lower extremity are present and symmetric 2+. No clonus is noted.  Patient arises from chair without difficulty. Narrow based gait with normal arm swing bilateral , able to walk on heels and toes . Tandem walk is stable. No pronator drift. Rhomberg negative.  Tenderness in the lower back on the right side.        Assessment & Plan:  1. Lumbar spondylosis and degenerative disc disease  2. Chronic low back pain predominantly right-sided worse with flexion, some discomfort with extension and prolonged walking  Refill pain medications Vicodin 5/325 not more than 2 per day.  She is also using tramadol up to 3 a day, she does not have a PCP at this point, [facility].Therefore I also refilled her Cymbalta.   Advised patient to continue with her exercises I showed her at the last visit, and with her walking program as pain permits. The patient had some exacerbation, prescribed Mobic, advised patient to not take any other anti-inflammatories with the Mobic, the patient states, that the Mobic helps her when she has exacerbations..  Might also consider aquatic therapy, injections, or referral to surgeon when patient has insurance  Encourage weight loss.

## 2012-06-19 ENCOUNTER — Telehealth: Payer: Self-pay | Admitting: *Deleted

## 2012-06-19 NOTE — Telephone Encounter (Signed)
Patient needs new handicap form

## 2012-06-20 ENCOUNTER — Other Ambulatory Visit: Payer: Self-pay | Admitting: Physical Medicine and Rehabilitation

## 2012-06-20 NOTE — Telephone Encounter (Signed)
Left message advising patient handicap form is ready for pick up.

## 2012-07-20 ENCOUNTER — Other Ambulatory Visit: Payer: Self-pay | Admitting: Physical Medicine and Rehabilitation

## 2012-07-21 ENCOUNTER — Other Ambulatory Visit: Payer: Self-pay

## 2012-07-21 MED ORDER — HYDROCODONE-ACETAMINOPHEN 5-325 MG PO TABS: 1.0000 | ORAL_TABLET | Freq: Two times a day (BID) | ORAL | Status: DC

## 2012-08-19 ENCOUNTER — Other Ambulatory Visit: Payer: Self-pay | Admitting: Physical Medicine and Rehabilitation

## 2012-08-20 ENCOUNTER — Telehealth: Payer: Self-pay

## 2012-08-20 MED ORDER — HYDROCODONE-ACETAMINOPHEN 5-325 MG PO TABS: 1.0000 | ORAL_TABLET | Freq: Two times a day (BID) | ORAL | Status: DC

## 2012-08-20 NOTE — Telephone Encounter (Signed)
Hydrocodone called into [facility].

## 2012-08-25 ENCOUNTER — Other Ambulatory Visit: Payer: Self-pay | Admitting: Physical Medicine and Rehabilitation

## 2012-08-26 ENCOUNTER — Encounter (HOSPITAL_COMMUNITY): Payer: Self-pay | Admitting: Emergency Medicine

## 2012-08-26 ENCOUNTER — Emergency Department (INDEPENDENT_AMBULATORY_CARE_PROVIDER_SITE_OTHER): Payer: Self-pay

## 2012-08-26 ENCOUNTER — Emergency Department (INDEPENDENT_AMBULATORY_CARE_PROVIDER_SITE_OTHER)
Admission: EM | Admit: 2012-08-26 | Discharge: 2012-08-26 | Disposition: A | Discharge status: Home / Self Care | Payer: Self-pay | Source: Home / Self Care | Attending: Family Medicine | Admitting: Family Medicine

## 2012-08-26 DIAGNOSIS — J111 Influenza due to unidentified influenza virus with other respiratory manifestations: Secondary | ICD-10-CM

## 2012-08-26 HISTORY — DX: Other chronic pain: G89.29

## 2012-08-26 HISTORY — DX: Dorsalgia, unspecified: M54.9

## 2012-08-26 MED ORDER — LEVOFLOXACIN 500 MG PO TABS: 500.0000 mg | ORAL_TABLET | Freq: Every day | ORAL | Status: DC

## 2012-08-26 NOTE — ED Provider Notes (Signed)
History     CSN: [id number]  Arrival date & time 08/26/12  1324   First MD Initiated Contact with Patient 08/26/12 1414      Chief Complaint  Patient presents with  . URI    (Consider location/radiation/quality/duration/timing/severity/associated sxs/prior treatment) Patient is a 52 y.o. female presenting with URI. The history is provided by the patient.  URI Presenting symptoms: congestion, cough and sore throat   Presenting symptoms: no fever and no rhinorrhea   Severity:  Moderate Onset quality:  Sudden Progression:  Partially resolved Associated symptoms: no wheezing   Risk factors comment:  Smoker.   Past Medical History  Diagnosis Date  . Arthritis   . Abnormal cells of cervix   . Chronic back pain     Past Surgical History  Procedure Laterality Date  . Colonoscopy    . Diagnostic mammogram      No family history on file.  History  Substance Use Topics  . Smoking status: Current Every Day Smoker -- 0.30 packs/day for 30 years  . Smokeless tobacco: Never Used     Comment: cutting back  . Alcohol Use: No     Comment: occ glass of wine    OB History   Grav Para Term Preterm Abortions TAB SAB Ect Mult Living                  Review of Systems  Constitutional: Negative for fever.  HENT: Positive for congestion and sore throat. Negative for rhinorrhea.   Respiratory: Positive for cough. Negative for shortness of breath and wheezing.   Cardiovascular: Negative.     Allergies  Wellbutrin  Home Medications   Current Outpatient Rx  Name  Route  Sig  Dispense  Refill  . clonazePAM (KLONOPIN) 0.5 MG tablet      TAKE ONE TABLET BY MOUTH TWICE DAILY AS NEEDED   30 tablet   2   . cyclobenzaprine (FLEXERIL) 10 MG tablet   Oral   Take 1 tablet (10 mg total) by mouth as needed.   30 tablet   2   . DULoxetine (CYMBALTA) 30 MG capsule   Oral   Take 1 capsule (30 mg total) by mouth 2 (two) times daily.   60 capsule   2   .  HYDROcodone-acetaminophen (NORCO/VICODIN) 5-325 MG per tablet   Oral   Take 1 tablet by mouth 2 (two) times daily.   60 tablet   0   . levofloxacin (LEVAQUIN) 500 MG tablet   Oral   Take 1 tablet (500 mg total) by mouth daily.   7 tablet   0   . meloxicam (MOBIC) 15 MG tablet   Oral   Take 1 tablet (15 mg total) by mouth daily.   30 tablet   2   . traMADol (ULTRAM) 50 MG tablet      TAKE ONE TABLET BY MOUTH THREE TIMES DAILY   30 tablet   2     BP 117/67  Pulse 75  Temp(Src) 99.2 F (37.3 C)  Resp 18  SpO2 99%  Physical Exam  Nursing note and vitals reviewed. Constitutional: She is oriented to person, place, and time. She appears well-developed and well-nourished.  HENT:  Head: Normocephalic.  Right Ear: External ear normal.  Left Ear: External ear normal.  Mouth/Throat: Oropharynx is clear and moist.  Neck: Normal range of motion. Neck supple.  Pulmonary/Chest: Effort normal. No respiratory distress. She has rhonchi.  Abdominal: Soft. Bowel sounds are normal.  Lymphadenopathy:    She has no cervical adenopathy.  Neurological: She is alert and oriented to person, place, and time.  Skin: Skin is warm and dry.    ED Course  Procedures (including critical care time)  Labs Reviewed - No data to display Dg Chest 2 View  08/26/2012  *RADIOLOGY REPORT*  Clinical Data: Cough for 2 weeks  CHEST - 2 VIEW  Comparison: None.  Findings: Lungs clear.  Heart size and pulmonary vascularity are normal.  No adenopathy.  No bone lesions.  IMPRESSION: No abnormality noted.   Original Report Authenticated By: Bretta Bang, M.D.      1. Bronchitis with influenza       MDM  X-rays reviewed and report per radiologist.         Linna Hoff, MD 08/26/12 [phone]

## 2012-08-26 NOTE — ED Notes (Signed)
Reports flu like symptoms for 8-10 days.  Reports cough, sore throat, chest congestion

## 2012-09-16 ENCOUNTER — Encounter: Payer: Self-pay | Admitting: Physical Medicine and Rehabilitation

## 2012-09-29 ENCOUNTER — Encounter: Payer: Self-pay | Admitting: Physical Medicine and Rehabilitation

## 2012-10-01 ENCOUNTER — Encounter: Payer: Self-pay | Admitting: Physical Medicine and Rehabilitation

## 2012-10-01 ENCOUNTER — Encounter: Payer: Self-pay | Attending: Physical Medicine and Rehabilitation | Admitting: Physical Medicine and Rehabilitation

## 2012-10-01 VITALS — BP 115/63 | HR 73 | Resp 14 | Ht 65.0 in | Wt 175.0 lb

## 2012-10-01 DIAGNOSIS — M51379 Other intervertebral disc degeneration, lumbosacral region without mention of lumbar back pain or lower extremity pain: Secondary | ICD-10-CM | POA: Insufficient documentation

## 2012-10-01 DIAGNOSIS — F17200 Nicotine dependence, unspecified, uncomplicated: Secondary | ICD-10-CM | POA: Insufficient documentation

## 2012-10-01 DIAGNOSIS — M5137 Other intervertebral disc degeneration, lumbosacral region: Secondary | ICD-10-CM | POA: Insufficient documentation

## 2012-10-01 DIAGNOSIS — G8929 Other chronic pain: Secondary | ICD-10-CM | POA: Insufficient documentation

## 2012-10-01 DIAGNOSIS — M545 Low back pain, unspecified: Secondary | ICD-10-CM | POA: Insufficient documentation

## 2012-10-01 DIAGNOSIS — M47817 Spondylosis without myelopathy or radiculopathy, lumbosacral region: Secondary | ICD-10-CM | POA: Insufficient documentation

## 2012-10-01 MED ORDER — CYCLOBENZAPRINE HCL 10 MG PO TABS: 10.0000 mg | ORAL_TABLET | ORAL | Status: DC | PRN

## 2012-10-01 MED ORDER — DULOXETINE HCL 30 MG PO CPEP: 30.0000 mg | ORAL_CAPSULE | Freq: Two times a day (BID) | ORAL | Status: DC

## 2012-10-01 MED ORDER — HYDROCODONE-ACETAMINOPHEN 5-325 MG PO TABS: 1.0000 | ORAL_TABLET | Freq: Two times a day (BID) | ORAL | Status: DC

## 2012-10-01 NOTE — Patient Instructions (Signed)
Continue with your walking and exercise program 

## 2012-10-01 NOTE — Progress Notes (Signed)
Subjective:    Patient ID: Latoya Collier, female    DOB: 01-04-1961, 52 y.o.   MRN: [id number]  HPI The patient complains about chronic low back pain which radiates into right side of her back. Prolonged standing or sitting aggrevates her pain, lying on her back alleviates her pain.  The problem has been stable .  Pain Inventory Average Pain 8 Pain Right Now 8 My pain is sharp, dull, stabbing and aching  In the last 24 hours, has pain interfered with the following? General activity 7 Relation with others 7 Enjoyment of life 7 What TIME of day is your pain at its worst? morning, day, evening Sleep (in general) Fair  Pain is worse with: walking, bending, sitting, standing and some activites Pain improves with: medication Relief from Meds: 5  Mobility Do you have any goals in this area?  no  Function not employed: date last employed unknown  Neuro/Psych No problems in this area  Prior Studies Any changes since last visit?  no  Physicians involved in your care Any changes since last visit?  no   History reviewed. No pertinent family history. History   Social History  . Marital Status: Divorced    Spouse Name: N/A    Number of Children: N/A  . Years of Education: N/A   Social History Main Topics  . Smoking status: Current Every Day Smoker -- 0.30 packs/day for 30 years  . Smokeless tobacco: Never Used     Comment: cutting back  . Alcohol Use: No     Comment: occ glass of wine  . Drug Use: No  . Sexually Active: None   Other Topics Concern  . None   Social History Narrative  . None   Past Surgical History  Procedure Laterality Date  . Colonoscopy    . Diagnostic mammogram     Past Medical History  Diagnosis Date  . Arthritis   . Abnormal cells of cervix   . Chronic back pain    BP 115/63  Pulse 73  Resp 14  Ht 5\' 5"  (1.651 m)  Wt 175 lb (79.379 kg)  BMI 29.12 kg/m2  SpO2 100%     Review of Systems  Musculoskeletal: Positive for back  pain.  All other systems reviewed and are negative.       Objective:   Physical Exam Constitutional: She is oriented to person, place, and time. She appears well-developed and well-nourished.  HENT:  Head: Normocephalic.  Neck: Neck supple.  Musculoskeletal: She exhibits tenderness.  Neurological: She is alert and oriented to person, place, and time.  Skin: Skin is warm and dry.  Psychiatric: She has a normal mood and affect.  Symmetric normal motor tone is noted throughout. Normal muscle bulk. Muscle testing reveals 5/5 muscle strength of the upper extremity, and 5/5 of the lower extremity. Full range of motion in upper and lower extremities. ROM of spine is restricted, because of pain. Fine motor movements are normal in both hands.  DTR in the upper and lower extremity are present and symmetric 2+. No clonus is noted.  Patient arises from chair without difficulty. Narrow based gait with normal arm swing bilateral , able to walk on heels and toes . Tandem walk is stable. No pronator drift. Rhomberg negative.  Tenderness in the lower back on the right side.        Assessment & Plan:  1. Lumbar spondylosis and degenerative disc disease  2. Chronic low back pain predominantly right-sided worse  with flexion, some discomfort with extension and prolonged walking  Refill pain medications Vicodin 5/325 not more than 2 per day.  She is also using tramadol up to 3 a day, she does not have a PCP at this point, [facility].Therefore I also refilled her Cymbalta, and her flexeril.  Advised patient to continue with her exercises I showed her at the last visit, and with her walking program as pain permits. The patient had some exacerbation, prescribed Mobic, advised patient to not take any other anti-inflammatories with the Mobic, the patient states, that the Mobic helps her when she has exacerbations.. Might also consider aquatic therapy, injections, or referral to surgeon when  patient has insurance  Encourage weight loss. Follow up in 3 month

## 2012-10-27 ENCOUNTER — Telehealth: Payer: Self-pay

## 2012-10-27 NOTE — Telephone Encounter (Signed)
Patient called requesting hydrocodone refill to [facility].

## 2012-10-29 ENCOUNTER — Other Ambulatory Visit: Payer: Self-pay | Admitting: Physical Medicine and Rehabilitation

## 2012-10-30 NOTE — Telephone Encounter (Signed)
Medication called in to pharmacy.

## 2012-11-20 ENCOUNTER — Other Ambulatory Visit: Payer: Self-pay | Admitting: Physical Medicine and Rehabilitation

## 2012-11-27 ENCOUNTER — Other Ambulatory Visit: Payer: Self-pay | Admitting: Physical Medicine and Rehabilitation

## 2012-11-29 ENCOUNTER — Other Ambulatory Visit: Payer: Self-pay | Admitting: Physical Medicine and Rehabilitation

## 2012-12-30 ENCOUNTER — Encounter: Payer: Self-pay | Admitting: Physical Medicine and Rehabilitation

## 2012-12-30 ENCOUNTER — Encounter
Payer: Medicaid Other | Attending: Physical Medicine and Rehabilitation | Admitting: Physical Medicine and Rehabilitation

## 2012-12-30 VITALS — BP 127/78 | HR 71 | Resp 14 | Ht 65.0 in | Wt 172.8 lb

## 2012-12-30 DIAGNOSIS — M47817 Spondylosis without myelopathy or radiculopathy, lumbosacral region: Secondary | ICD-10-CM | POA: Insufficient documentation

## 2012-12-30 DIAGNOSIS — M545 Low back pain, unspecified: Secondary | ICD-10-CM | POA: Insufficient documentation

## 2012-12-30 DIAGNOSIS — M51379 Other intervertebral disc degeneration, lumbosacral region without mention of lumbar back pain or lower extremity pain: Secondary | ICD-10-CM | POA: Insufficient documentation

## 2012-12-30 DIAGNOSIS — M5137 Other intervertebral disc degeneration, lumbosacral region: Secondary | ICD-10-CM | POA: Insufficient documentation

## 2012-12-30 DIAGNOSIS — M549 Dorsalgia, unspecified: Secondary | ICD-10-CM

## 2012-12-30 DIAGNOSIS — M25579 Pain in unspecified ankle and joints of unspecified foot: Secondary | ICD-10-CM

## 2012-12-30 DIAGNOSIS — M25571 Pain in right ankle and joints of right foot: Secondary | ICD-10-CM

## 2012-12-30 DIAGNOSIS — G8929 Other chronic pain: Secondary | ICD-10-CM | POA: Insufficient documentation

## 2012-12-30 DIAGNOSIS — F17200 Nicotine dependence, unspecified, uncomplicated: Secondary | ICD-10-CM | POA: Insufficient documentation

## 2012-12-30 DIAGNOSIS — M79609 Pain in unspecified limb: Secondary | ICD-10-CM | POA: Insufficient documentation

## 2012-12-30 MED ORDER — DICLOFENAC SODIUM 1 % TD GEL: 2.0000 g | Freq: Four times a day (QID) | TRANSDERMAL | Status: DC

## 2012-12-30 MED ORDER — MELOXICAM 15 MG PO TABS: 15.0000 mg | ORAL_TABLET | Freq: Every day | ORAL | Status: DC

## 2012-12-30 MED ORDER — HYDROCODONE-ACETAMINOPHEN 5-325 MG PO TABS: ORAL_TABLET | ORAL | Status: DC

## 2012-12-30 MED ORDER — DULOXETINE HCL 30 MG PO CPEP: 30.0000 mg | ORAL_CAPSULE | Freq: Two times a day (BID) | ORAL | Status: DC

## 2012-12-30 MED ORDER — CLONAZEPAM 0.5 MG PO TABS: ORAL_TABLET | ORAL | Status: DC

## 2012-12-30 NOTE — Patient Instructions (Signed)
Try to exercise as pain permits.

## 2012-12-30 NOTE — Progress Notes (Signed)
Subjective:    Patient ID: Latoya Collier, female    DOB: [birth year], 52 y.o.   MRN: [id number]  HPI The patient complains about chronic low back pain which radiates into right side of her back. Prolonged standing or sitting aggrevates her pain, lying on her back alleviates her pain.  The problem has been stable .  The patient also complains about right ankle/foot pain, this started 4 weeks ago after she rolled over her right ankle, then it subsided after 2 weeks, after this she had the symptoms again for a couple days, but now she does not have any symptoms for the last 4 days.  Pain Inventory Average Pain 8 Pain Right Now 8 My pain is constant, sharp, stabbing and aching  In the last 24 hours, has pain interfered with the following? General activity 3 Relation with others 3 Enjoyment of life 3 What TIME of day is your pain at its worst? all Sleep (in general) Poor  Pain is worse with: walking, bending, sitting, standing and some activites Pain improves with: rest and medication Relief from Meds: 6  Mobility walk without assistance ability to climb steps?  yes do you drive?  yes  Function Do you have any goals in this area?  no  Neuro/Psych No problems in this area  Prior Studies Any changes since last visit?  no  Physicians involved in your care Any changes since last visit?  no   No family history on file. History   Social History  . Marital Status: Divorced    Spouse Name: N/A    Number of Children: N/A  . Years of Education: N/A   Social History Main Topics  . Smoking status: Current Every Day Smoker -- 0.30 packs/day for 30 years  . Smokeless tobacco: Never Used     Comment: cutting back  . Alcohol Use: No     Comment: occ glass of wine  . Drug Use: No  . Sexually Active: None   Other Topics Concern  . None   Social History Narrative  . None   Past Surgical History  Procedure Laterality Date  . Colonoscopy    . Diagnostic mammogram      Past Medical History  Diagnosis Date  . Arthritis   . Abnormal cells of cervix   . Chronic back pain    BP 127/78  Pulse 71  Resp 14  Ht 5\' 5"  (1.651 m)  Wt 172 lb 12.8 oz (78.382 kg)  BMI 28.76 kg/m2  SpO2 98%   Review of Systems  Musculoskeletal: Positive for back pain and gait problem.  All other systems reviewed and are negative.       Objective:   Physical Exam Constitutional: She is oriented to person, place, and time. She appears well-developed and well-nourished.  HENT:  Head: Normocephalic.  Neck: Neck supple.  Musculoskeletal: She exhibits tenderness.  Neurological: She is alert and oriented to person, place, and time.  Skin: Skin is warm and dry.  Psychiatric: She has a normal mood and affect.  Symmetric normal motor tone is noted throughout. Normal muscle bulk. Muscle testing reveals 5/5 muscle strength of the upper extremity, and 5/5 of the lower extremity. Full range of motion in upper and lower extremities. ROM of spine is restricted, because of pain. Fine motor movements are normal in both hands.  DTR in the upper and lower extremity are present and symmetric 2+. No clonus is noted.  Patient arises from chair without difficulty. Narrow based  gait with normal arm swing bilateral , able to walk on heels and toes . Tandem walk is stable. No pronator drift. Rhomberg negative.  Tenderness in the lower back on the right side. Right ankle : full ROM, NO pain at insertion of anterior talo-fibular ligament, no pain on e- and inversion even with pressure,[place] signs where negative. No swelling, no pain on palpation        Assessment & Plan:  1. Lumbar spondylosis and degenerative disc disease  2. Chronic low back pain predominantly right-sided worse with flexion, some discomfort with extension and prolonged walking  3.Right ankle/ foot pain : full ROM, NO pain at insertion of anterior talo-fibular ligament, no pain on e- and inversion even with pressure,[place]  signs where negative. No swelling, no pain on palpation. Will order X-ray if symptoms re ocure.  Refill pain medications Vicodin 5/325 not more than 2 per day.  She is also using tramadol up to 3 a day, she does not have a PCP at this point, [facility].Therefore I also refilled her Cymbalta, and her Mobic.  Advised patient to continue with her exercises I showed her at the last visit, and with her walking program as pain permits.  Consider MRI of L-spine, if radiating Sx are more prominent again, today no signs of nerve or spinal cord impingement.  Might also consider aquatic therapy, injections, or referral to surgeon when patient has insurance.  Encourage weight loss.  Follow up in 3 month

## 2013-01-26 ENCOUNTER — Other Ambulatory Visit: Payer: Self-pay | Admitting: Physical Medicine and Rehabilitation

## 2013-02-08 ENCOUNTER — Other Ambulatory Visit: Payer: Self-pay | Admitting: Physical Medicine and Rehabilitation

## 2013-02-10 ENCOUNTER — Other Ambulatory Visit: Payer: Self-pay | Admitting: Physical Medicine and Rehabilitation

## 2013-02-23 ENCOUNTER — Other Ambulatory Visit: Payer: Self-pay | Admitting: Physical Medicine and Rehabilitation

## 2013-02-26 ENCOUNTER — Telehealth: Payer: Self-pay

## 2013-02-26 NOTE — Telephone Encounter (Signed)
Patient called complaining of right ankle pain.  She has tried voltaren gel and a brace but the pain is getting worse.  She would like an xray.  She says she had discussed this with Clydie Braun at her last visit and order should already be done.

## 2013-03-02 ENCOUNTER — Other Ambulatory Visit: Payer: Self-pay

## 2013-03-02 MED ORDER — DULOXETINE HCL 30 MG PO CPEP: 30.0000 mg | ORAL_CAPSULE | Freq: Two times a day (BID) | ORAL | Status: DC

## 2013-03-02 NOTE — Telephone Encounter (Signed)
That was 2 month ago, but I put the order in , dg complete of right ankle, at [facility]

## 2013-03-02 NOTE — Addendum Note (Signed)
Addended by: Su Monks on: 03/02/2013 08:59 AM   Modules accepted: Orders

## 2013-03-24 ENCOUNTER — Other Ambulatory Visit: Payer: Self-pay | Admitting: Physical Medicine and Rehabilitation

## 2013-03-24 ENCOUNTER — Encounter: Payer: Medicaid Other | Admitting: Physical Medicine and Rehabilitation

## 2013-03-26 ENCOUNTER — Other Ambulatory Visit: Payer: Self-pay | Admitting: Physical Medicine and Rehabilitation

## 2013-04-02 ENCOUNTER — Other Ambulatory Visit: Payer: Self-pay

## 2013-04-02 ENCOUNTER — Encounter
Payer: Medicaid Other | Attending: Physical Medicine and Rehabilitation | Admitting: Physical Medicine and Rehabilitation

## 2013-04-02 ENCOUNTER — Encounter: Payer: Self-pay | Admitting: Physical Medicine and Rehabilitation

## 2013-04-02 VITALS — BP 143/72 | HR 63 | Resp 14 | Ht 65.0 in | Wt 171.6 lb

## 2013-04-02 DIAGNOSIS — M5137 Other intervertebral disc degeneration, lumbosacral region: Secondary | ICD-10-CM | POA: Insufficient documentation

## 2013-04-02 DIAGNOSIS — M51379 Other intervertebral disc degeneration, lumbosacral region without mention of lumbar back pain or lower extremity pain: Secondary | ICD-10-CM | POA: Insufficient documentation

## 2013-04-02 DIAGNOSIS — G8929 Other chronic pain: Secondary | ICD-10-CM | POA: Insufficient documentation

## 2013-04-02 DIAGNOSIS — F17200 Nicotine dependence, unspecified, uncomplicated: Secondary | ICD-10-CM | POA: Insufficient documentation

## 2013-04-02 DIAGNOSIS — M25571 Pain in right ankle and joints of right foot: Secondary | ICD-10-CM

## 2013-04-02 DIAGNOSIS — M545 Low back pain, unspecified: Secondary | ICD-10-CM | POA: Insufficient documentation

## 2013-04-02 DIAGNOSIS — M47817 Spondylosis without myelopathy or radiculopathy, lumbosacral region: Secondary | ICD-10-CM | POA: Insufficient documentation

## 2013-04-02 DIAGNOSIS — M25579 Pain in unspecified ankle and joints of unspecified foot: Secondary | ICD-10-CM | POA: Insufficient documentation

## 2013-04-02 MED ORDER — HYDROCODONE-ACETAMINOPHEN 5-325 MG PO TABS: 1.0000 | ORAL_TABLET | Freq: Two times a day (BID) | ORAL | Status: DC | PRN

## 2013-04-02 MED ORDER — TRAMADOL HCL 50 MG PO TABS: ORAL_TABLET | ORAL | Status: DC

## 2013-04-02 MED ORDER — DULOXETINE HCL 30 MG PO CPEP: 30.0000 mg | ORAL_CAPSULE | Freq: Two times a day (BID) | ORAL | Status: DC

## 2013-04-02 NOTE — Progress Notes (Signed)
Subjective:    Patient ID: Latoya Collier, female    DOB: [birth year], 52 y.o.   MRN: [id number]  HPI The patient complains about chronic low back pain which radiates into right side of her back. Prolonged standing or sitting aggrevates her pain, lying on her back alleviates her pain.  The problem has been stable .  The patient also complains about right ankle/foot pain, this started 3 month ago, after she rolled over her right ankle, then it subsided after she was wearing a brace and using the voltaren gel, when she stopped doing this she had pain in her ankle again after doing to much.   Pain Inventory Average Pain 8 Pain Right Now 8 My pain is sharp, burning, dull, stabbing and aching  In the last 24 hours, has pain interfered with the following? General activity 2 Relation with others 2 Enjoyment of life 2 What TIME of day is your pain at its worst? morning and daytime Sleep (in general) Fair  Pain is worse with: walking, bending, sitting and some activites Pain improves with: medication Relief from Meds: 5  Mobility walk without assistance  Function Do you have any goals in this area?  no  Neuro/Psych trouble walking depression anxiety  Prior Studies Any changes since last visit?  no  Physicians involved in your care Any changes since last visit?  no   History reviewed. No pertinent family history. History   Social History  . Marital Status: Divorced    Spouse Name: N/A    Number of Children: N/A  . Years of Education: N/A   Social History Main Topics  . Smoking status: Current Every Day Smoker -- 0.30 packs/day for 30 years  . Smokeless tobacco: Never Used     Comment: cutting back  . Alcohol Use: No     Comment: occ glass of wine  . Drug Use: No  . Sexual Activity: None   Other Topics Concern  . None   Social History Narrative  . None   Past Surgical History  Procedure Laterality Date  . Colonoscopy    . Diagnostic mammogram     Past  Medical History  Diagnosis Date  . Arthritis   . Abnormal cells of cervix   . Chronic back pain    BP 143/72  Pulse 63  Resp 14  Ht 5\' 5"  (1.651 m)  Wt 171 lb 9.6 oz (77.837 kg)  BMI 28.56 kg/m2  SpO2 96%    Review of Systems  Musculoskeletal: Positive for back pain and gait problem.  Psychiatric/Behavioral: Positive for dysphoric mood. The patient is nervous/anxious.   All other systems reviewed and are negative.       Objective:   Physical Exam Constitutional: She is oriented to person, place, and time. She appears well-developed and well-nourished.  HENT:  Head: Normocephalic.  Neck: Neck supple.  Musculoskeletal: She exhibits tenderness.  Neurological: She is alert and oriented to person, place, and time.  Skin: Skin is warm and dry.  Psychiatric: She has a normal mood and affect.  Symmetric normal motor tone is noted throughout. Normal muscle bulk. Muscle testing reveals 5/5 muscle strength of the upper extremity, and 5/5 of the lower extremity. Full range of motion in upper and lower extremities. ROM of spine is restricted, because of pain. Fine motor movements are normal in both hands.  DTR in the upper and lower extremity are present and symmetric 2+. No clonus is noted.  Patient arises from chair without difficulty.  Narrow based gait with normal arm swing bilateral , able to walk on heels and toes . Tandem walk is stable. No pronator drift. Rhomberg negative.  Tenderness in the lower back on the right side.  Right ankle : full ROM, NO pain at insertion of anterior talo-fibular ligament, no pain on e- and inversion even with pressure,[place] signs where negative. No swelling, no pain on palpation        Assessment & Plan:  1. Lumbar spondylosis and degenerative disc disease  2. Chronic low back pain predominantly right-sided worse with flexion, some discomfort with extension and prolonged walking  3.Right ankle/ foot pain : full ROM, NO pain at insertion of  anterior talo-fibular ligament, no pain on e- and inversion even with pressure,[place] signs where negative. No swelling, no pain on palpation.  Will order X-ray if symptoms re ocure, ordered x-rays, but patient did not go, because it is much better.  Refill pain medications Vicodin 5/325 not more than 2 per day.  She is also using tramadol up to 3 a day, and Mobic.  Advised patient to continue with her exercises for her back, I showed her at the last visit, and with her walking program as pain permits. Also showed patient exercises to stabilize her ankle joint,advised her to continue to wear a sleeve/ brace over her right ankle when she walks outside the house, also she can continue with the voltaren gel. Spent with patient face to face. Consider MRI of L-spine, if radiating Sx are more prominent again, today no signs of nerve or spinal cord impingement.  Might also consider aquatic therapy, injections, or referral to surgeon ,when patient has insurance.She has medicaid now, but her low back is pretty stable at this point.  Encourage weight loss.  Follow up in 1 month

## 2013-04-02 NOTE — Patient Instructions (Signed)
Try to do the exercises I showed you, regularly every day

## 2013-04-03 ENCOUNTER — Telehealth: Payer: Self-pay

## 2013-04-03 NOTE — Telephone Encounter (Signed)
Patient called to see if she could get a RX for an ankle brace so her insurance will cover it.

## 2013-04-03 NOTE — Telephone Encounter (Signed)
Patient advised to get a support not a brace.  She understands.

## 2013-04-03 NOTE — Telephone Encounter (Signed)
She does not need a full medical ankle brace, she only needs some support, like an ankle sleeve which provides some stability,she can get those at [facility], and she should do the stabilization exercises I showed her, those are more helpful anyhow.

## 2013-04-27 ENCOUNTER — Other Ambulatory Visit: Payer: Self-pay | Admitting: Physical Medicine and Rehabilitation

## 2013-05-01 ENCOUNTER — Telehealth: Payer: Self-pay

## 2013-05-01 NOTE — Telephone Encounter (Signed)
erroneous

## 2013-05-05 ENCOUNTER — Telehealth: Payer: Self-pay

## 2013-05-05 NOTE — Telephone Encounter (Signed)
Received message from patient but unable to understand message.

## 2013-05-05 NOTE — Telephone Encounter (Signed)
Left message for patient to call office regarding the message she left the other day that was unclear.

## 2013-05-05 NOTE — Telephone Encounter (Signed)
Patient wanted to let us know that she is now just getting her medications cymbalta filled at [facility] with her medicaid.

## 2013-05-13 ENCOUNTER — Other Ambulatory Visit: Payer: Self-pay

## 2013-05-13 ENCOUNTER — Other Ambulatory Visit: Payer: Self-pay | Admitting: Physical Medicine and Rehabilitation

## 2013-05-13 MED ORDER — CLONAZEPAM 0.5 MG PO TABS: ORAL_TABLET | ORAL | Status: DC

## 2013-05-13 NOTE — Telephone Encounter (Signed)
Clonazepam called into Pharmacy.

## 2013-05-28 ENCOUNTER — Telehealth: Payer: Self-pay

## 2013-05-28 ENCOUNTER — Ambulatory Visit: Payer: Medicaid Other | Admitting: Physical Medicine and Rehabilitation

## 2013-05-28 ENCOUNTER — Other Ambulatory Visit: Payer: Self-pay | Admitting: Physical Medicine and Rehabilitation

## 2013-05-28 NOTE — Telephone Encounter (Signed)
It is ok to refill 

## 2013-05-28 NOTE — Telephone Encounter (Signed)
Patient request flexeril and meloxicam refill.  Please advise.

## 2013-05-29 MED ORDER — CYCLOBENZAPRINE HCL 10 MG PO TABS: 10.0000 mg | ORAL_TABLET | ORAL | Status: DC | PRN

## 2013-05-29 MED ORDER — MELOXICAM 15 MG PO TABS: ORAL_TABLET | ORAL | Status: DC

## 2013-05-29 NOTE — Telephone Encounter (Signed)
Flexeril and Meloxicam refills sent to pharmacy. Left a voicemail informing patient that medications had been sent to pharmacy.

## 2013-06-01 ENCOUNTER — Encounter
Payer: Medicaid Other | Attending: Physical Medicine and Rehabilitation | Admitting: Physical Medicine and Rehabilitation

## 2013-06-01 ENCOUNTER — Encounter: Payer: Self-pay | Admitting: Physical Medicine and Rehabilitation

## 2013-06-01 VITALS — BP 124/43 | HR 84 | Resp 14 | Ht 65.0 in | Wt 173.0 lb

## 2013-06-01 DIAGNOSIS — M51379 Other intervertebral disc degeneration, lumbosacral region without mention of lumbar back pain or lower extremity pain: Secondary | ICD-10-CM | POA: Insufficient documentation

## 2013-06-01 DIAGNOSIS — G8929 Other chronic pain: Secondary | ICD-10-CM | POA: Insufficient documentation

## 2013-06-01 DIAGNOSIS — M47817 Spondylosis without myelopathy or radiculopathy, lumbosacral region: Secondary | ICD-10-CM

## 2013-06-01 DIAGNOSIS — M25561 Pain in right knee: Secondary | ICD-10-CM

## 2013-06-01 DIAGNOSIS — M5137 Other intervertebral disc degeneration, lumbosacral region: Secondary | ICD-10-CM | POA: Insufficient documentation

## 2013-06-01 DIAGNOSIS — M25571 Pain in right ankle and joints of right foot: Secondary | ICD-10-CM

## 2013-06-01 DIAGNOSIS — Z79899 Other long term (current) drug therapy: Secondary | ICD-10-CM

## 2013-06-01 DIAGNOSIS — M25569 Pain in unspecified knee: Secondary | ICD-10-CM

## 2013-06-01 DIAGNOSIS — Z5181 Encounter for therapeutic drug level monitoring: Secondary | ICD-10-CM

## 2013-06-01 DIAGNOSIS — M79609 Pain in unspecified limb: Secondary | ICD-10-CM | POA: Insufficient documentation

## 2013-06-01 DIAGNOSIS — M25579 Pain in unspecified ankle and joints of unspecified foot: Secondary | ICD-10-CM

## 2013-06-01 MED ORDER — HYDROCODONE-ACETAMINOPHEN 5-325 MG PO TABS: 1.0000 | ORAL_TABLET | Freq: Two times a day (BID) | ORAL | Status: DC | PRN

## 2013-06-01 NOTE — Progress Notes (Signed)
Subjective:    Patient ID: Latoya Collier, female    DOB: [birth year], 52 y.o.   MRN: [id number]  HPI The patient complains about chronic low back pain which radiates into right side of her back. Prolonged standing or sitting aggrevates her pain, lying on her back alleviates her pain.  The problem has been stable .  The patient also complains about right ankle/foot pain, this started 3 month ago, after she rolled over her right ankle, then it subsided after she was wearing a brace and using the voltaren gel, when she stopped doing this she had pain in her ankle again after doing to much, on some days.  She also complains about mild knee pain on some days.   Pain Inventory Average Pain 7 Pain Right Now 8 My pain is sharp, burning, dull, stabbing and aching  In the last 24 hours, has pain interfered with the following? General activity 7 Relation with others 7 Enjoyment of life 7 What TIME of day is your pain at its worst? morning Sleep (in general) Fair  Pain is worse with: walking, bending, sitting and standing Pain improves with: rest and medication Relief from Meds: 6  Mobility walk without assistance  Function Do you have any goals in this area?  no  Neuro/Psych No problems in this area  Prior Studies Any changes since last visit?  no  Physicians involved in your care Any changes since last visit?  no   History reviewed. No pertinent family history. History   Social History  . Marital Status: Divorced    Spouse Name: N/A    Number of Children: N/A  . Years of Education: N/A   Social History Main Topics  . Smoking status: Current Every Day Smoker -- 0.30 packs/day for 30 years  . Smokeless tobacco: Never Used     Comment: cutting back  . Alcohol Use: No     Comment: occ glass of wine  . Drug Use: No  . Sexual Activity: None   Other Topics Concern  . None   Social History Narrative  . None   Past Surgical History  Procedure Laterality Date  .  Colonoscopy    . Diagnostic mammogram     Past Medical History  Diagnosis Date  . Arthritis   . Abnormal cells of cervix   . Chronic back pain    BP 124/43  Pulse 84  Resp 14  Ht 5\' 5"  (1.651 m)  Wt 173 lb (78.472 kg)  BMI 28.79 kg/m2  SpO2 96%   Review of Systems  Musculoskeletal: Positive for back pain.  All other systems reviewed and are negative.       Objective:   Physical Exam Constitutional: She is oriented to person, place, and time. She appears well-developed and well-nourished.  HENT:  Head: Normocephalic.  Neck: Neck supple.  Musculoskeletal: She exhibits tenderness.  Neurological: She is alert and oriented to person, place, and time.  Skin: Skin is warm and dry.  Psychiatric: She has a normal mood and affect.  Symmetric normal motor tone is noted throughout. Normal muscle bulk. Muscle testing reveals 5/5 muscle strength of the upper extremity, and 5/5 of the lower extremity. Full range of motion in upper and lower extremities. ROM of spine is restricted, because of pain. Fine motor movements are normal in both hands.  DTR in the upper and lower extremity are present and symmetric 2+. No clonus is noted.  Patient arises from chair without difficulty. Narrow based gait  with normal arm swing bilateral , able to walk on heels and toes . Tandem walk is stable. No pronator drift. Rhomberg negative.  Tenderness in the lower back on the right side.  Right ankle : full ROM, NO pain at insertion of anterior talo-fibular ligament, no pain on e- and inversion even with pressure,[place] signs where negative. No swelling, no pain on palpation Right knee : Very mild effusion, full ROM, NO instability       Assessment & Plan:  1. Lumbar spondylosis and degenerative disc disease  2. Chronic low back pain predominantly right-sided worse with flexion, some discomfort with extension and prolonged walking  3.Right ankle/ foot pain : full ROM, NO pain at insertion of anterior  talo-fibular ligament, no pain on e- and inversion even with pressure,[place] signs where negative. No swelling, no pain on palpation.  Will order X-ray if symptoms re ocure, ordered x-rays, but patient did not go. Advised patient to finally get the X-rays, because she has still pain sometimes, just to make sure 4. Right knee pain, on some days, very mild effusion, no restriction in movement, stable, ordered X-ray  Refill pain medications Vicodin 5/325 not more than 2 per day.  She is also using tramadol up to 3 a day, and Mobic.  Advised patient to continue with her exercises for her back, I showed her at the last visit, and with her walking program as pain permits. Also showed patient exercises to stabilize her ankle joint,advised her to continue to wear a sleeve/ brace over her right ankle when she walks outside the house, also she can continue with the voltaren gel. Consider MRI of L-spine, if radiating Sx are more prominent again, today no signs of nerve or spinal cord impingement.  Might also consider aquatic therapy, injections, or referral to surgeon ,when patient has insurance.She has medicaid now, but her low back is pretty stable at this point.  Encourage weight loss.  Follow up in 1 month

## 2013-06-01 NOTE — Patient Instructions (Signed)
Continue with your exercises as tolerated, please get the X-rays done

## 2013-06-02 ENCOUNTER — Ambulatory Visit (HOSPITAL_COMMUNITY)
Admission: RE | Admit: 2013-06-02 | Discharge: 2013-06-02 | Disposition: A | Discharge status: Home / Self Care | Payer: Medicaid Other | Source: Ambulatory Visit | Attending: Physical Medicine and Rehabilitation | Admitting: Physical Medicine and Rehabilitation

## 2013-06-02 DIAGNOSIS — M25561 Pain in right knee: Secondary | ICD-10-CM

## 2013-06-02 DIAGNOSIS — M25579 Pain in unspecified ankle and joints of unspecified foot: Secondary | ICD-10-CM | POA: Insufficient documentation

## 2013-06-02 DIAGNOSIS — M25571 Pain in right ankle and joints of right foot: Secondary | ICD-10-CM

## 2013-06-02 DIAGNOSIS — M25569 Pain in unspecified knee: Secondary | ICD-10-CM | POA: Insufficient documentation

## 2013-06-03 ENCOUNTER — Telehealth: Payer: Self-pay

## 2013-06-03 NOTE — Telephone Encounter (Signed)
Patient aware of xray results.  Patient would like to know what to do next?  She is having trouble walking still.  Please advise.

## 2013-06-03 NOTE — Telephone Encounter (Signed)
Left patient a voicemail to return call to clinic regarding x-rays of right ankle and right knee.   Per Latoya Collier::: Please let patient know that her x-rays of her right ankle and right knee are normal, without significant findings.

## 2013-06-03 NOTE — Telephone Encounter (Signed)
I can order PT, which I suggested to her, but she did not wanted to do that I think because of financial reasons, but I will be happy to order PT or aquatic PT

## 2013-06-08 NOTE — Telephone Encounter (Signed)
Patient returned call to clinic

## 2013-06-08 NOTE — Addendum Note (Signed)
Addended by: Su Monks on: 06/08/2013 04:06 PM   Modules accepted: Orders

## 2013-06-08 NOTE — Telephone Encounter (Signed)
Aquatic therapy would be most beneficial, her ankle / foot, did not show any tissue swelling, she can put weight on  the ankle it is not necessary at this point to order an MRI, she also had no swelling, could put her full weight on it, had full ROM, and did not have instability in her ankle on her PE.

## 2013-06-08 NOTE — Telephone Encounter (Signed)
Left message for patient to call office regarding physical therapy suggestions.

## 2013-06-08 NOTE — Telephone Encounter (Signed)
Patient would like to do therapy.  She was also concerned that she might should get an MRI.

## 2013-06-08 NOTE — Telephone Encounter (Signed)
Put order for PT in, for strengthening of the right LE, and balance training

## 2013-06-23 ENCOUNTER — Other Ambulatory Visit: Payer: Self-pay | Admitting: *Deleted

## 2013-06-23 MED ORDER — HYDROCODONE-ACETAMINOPHEN 5-325 MG PO TABS: 1.0000 | ORAL_TABLET | Freq: Two times a day (BID) | ORAL | Status: DC | PRN

## 2013-06-23 MED ORDER — TRAMADOL HCL 50 MG PO TABS: ORAL_TABLET | ORAL | Status: DC

## 2013-06-23 NOTE — Telephone Encounter (Signed)
RX printed early for controlled medication for the visit with RN on 06/29/13 (to be signed by MD) 

## 2013-06-26 ENCOUNTER — Telehealth: Payer: Self-pay

## 2013-06-26 NOTE — Telephone Encounter (Signed)
This is not a medication that would recommend for long-term use in conjunction with hydrocodone.  Call in prescription for Klonopin 0.5 mg #30, 1 per os daily, for one month and then will discontinue

## 2013-06-26 NOTE — Telephone Encounter (Signed)
Refill reqeust from [facility] for clonazepam 0.5mg .  Takes 1 tablet po bid.  Please advise.

## 2013-06-29 ENCOUNTER — Encounter: Payer: Self-pay | Admitting: *Deleted

## 2013-06-29 ENCOUNTER — Encounter: Payer: Medicaid Other | Attending: Physical Medicine & Rehabilitation | Admitting: *Deleted

## 2013-06-29 VITALS — BP 120/53 | HR 86 | Resp 14

## 2013-06-29 DIAGNOSIS — M47816 Spondylosis without myelopathy or radiculopathy, lumbar region: Secondary | ICD-10-CM

## 2013-06-29 DIAGNOSIS — M25569 Pain in unspecified knee: Secondary | ICD-10-CM | POA: Insufficient documentation

## 2013-06-29 DIAGNOSIS — M25571 Pain in right ankle and joints of right foot: Secondary | ICD-10-CM

## 2013-06-29 DIAGNOSIS — G8929 Other chronic pain: Secondary | ICD-10-CM

## 2013-06-29 DIAGNOSIS — M25579 Pain in unspecified ankle and joints of unspecified foot: Secondary | ICD-10-CM | POA: Insufficient documentation

## 2013-06-29 MED ORDER — CLONAZEPAM 0.5 MG PO TABS: ORAL_TABLET | ORAL | Status: DC

## 2013-06-29 NOTE — Progress Notes (Signed)
Here for pill count and medication refills. Fill date  06/01/13    # 60  Today NV# 4  Pill counts are appropriate.  Her right foot is still giving her a great deal of pain and some days it hurts to put on ground.  I will have her follow up with Dr Wynn Banker next month.  Latoya Collier had ordered PT but because of her insurance she was not able to follow through with this.  She wonders about having an MRI of her foot.  Refill given on the tramadol and hydrocodone 5/325.

## 2013-06-29 NOTE — Patient Instructions (Signed)
Follow up one month with RN for pill count and med refill 

## 2013-06-30 ENCOUNTER — Ambulatory Visit: Payer: Medicaid Other

## 2013-06-30 NOTE — Telephone Encounter (Signed)
Sybil called klonopin into the pharmacy.

## 2013-07-31 ENCOUNTER — Ambulatory Visit: Payer: Medicaid Other | Admitting: Physical Medicine & Rehabilitation

## 2013-08-03 ENCOUNTER — Ambulatory Visit (HOSPITAL_BASED_OUTPATIENT_CLINIC_OR_DEPARTMENT_OTHER): Payer: Medicaid Other | Admitting: Physical Medicine & Rehabilitation

## 2013-08-03 ENCOUNTER — Encounter: Payer: Self-pay | Admitting: Physical Medicine & Rehabilitation

## 2013-08-03 ENCOUNTER — Encounter: Payer: Medicaid Other | Attending: Physical Medicine & Rehabilitation

## 2013-08-03 VITALS — BP 150/76 | HR 90 | Resp 14 | Ht 65.0 in | Wt 173.0 lb

## 2013-08-03 DIAGNOSIS — M25579 Pain in unspecified ankle and joints of unspecified foot: Secondary | ICD-10-CM

## 2013-08-03 DIAGNOSIS — M545 Low back pain, unspecified: Secondary | ICD-10-CM

## 2013-08-03 DIAGNOSIS — G8929 Other chronic pain: Secondary | ICD-10-CM

## 2013-08-03 DIAGNOSIS — M79609 Pain in unspecified limb: Secondary | ICD-10-CM | POA: Insufficient documentation

## 2013-08-03 DIAGNOSIS — Z8781 Personal history of (healed) traumatic fracture: Secondary | ICD-10-CM | POA: Insufficient documentation

## 2013-08-03 DIAGNOSIS — F17200 Nicotine dependence, unspecified, uncomplicated: Secondary | ICD-10-CM | POA: Insufficient documentation

## 2013-08-03 MED ORDER — HYDROCODONE-ACETAMINOPHEN 5-325 MG PO TABS: 1.0000 | ORAL_TABLET | Freq: Two times a day (BID) | ORAL | Status: DC | PRN

## 2013-08-03 MED ORDER — TRAMADOL HCL 50 MG PO TABS: ORAL_TABLET | ORAL | Status: DC

## 2013-08-03 NOTE — Progress Notes (Signed)
Subjective:    Patient ID: Latoya Collier, female    DOB: [birth year], 53 y.o.   MRN: [id number]  HPI R foot and ankle hurting turned ankle several months ago History of Broken foot ~10 yrs ago required CAM walker boot R foot/ankle Xray 06/01/2013 negative R Knee Xray Pain Inventory Average Pain 8 Pain Right Now 8 My pain is sharp, dull, stabbing, aching and other  In the last 24 hours, has pain interfered with the following? General activity 7 Relation with others 7 Enjoyment of life 8 What TIME of day is your pain at its worst? morning, daytime, evening Sleep (in general) Fair  Pain is worse with: walking, bending, sitting, standing and some activites Pain improves with: rest, medication and other Relief from Meds: 6  Mobility walk without assistance walk with assistance use a cane how many minutes can you walk? 5-10 ability to climb steps?  yes do you drive?  yes  Function not employed: date last employed na  Neuro/Psych No problems in this area  Prior Studies Any changes since last visit?  no  Physicians involved in your care Any changes since last visit?  no   History reviewed. No pertinent family history. History   Social History  . Marital Status: Divorced    Spouse Name: N/A    Number of Children: N/A  . Years of Education: N/A   Social History Main Topics  . Smoking status: Current Every Day Smoker -- 0.30 packs/day for 30 years  . Smokeless tobacco: Never Used     Comment: cutting back  . Alcohol Use: No     Comment: occ glass of wine  . Drug Use: No  . Sexual Activity: None   Other Topics Concern  . None   Social History Narrative  . None   Past Surgical History  Procedure Laterality Date  . Colonoscopy    . Diagnostic mammogram     Past Medical History  Diagnosis Date  . Arthritis   . Abnormal cells of cervix   . Chronic back pain    BP 150/76  Pulse 90  Resp 14  Ht 5\' 5"  (1.651 m)  Wt 173 lb (78.472 kg)  BMI 28.79  kg/m2  SpO2 98%      Review of Systems  Musculoskeletal: Positive for back pain.  All other systems reviewed and are negative.       Objective:   Physical Exam  Cardiovascular:  Pulses:      Dorsalis pedis pulses are 2+ on the right side.       Posterior tibial pulses are 2+ on the right side.  Musculoskeletal:       Right ankle: She exhibits normal range of motion, no swelling, no ecchymosis and no deformity. No tenderness. No lateral malleolus, no medial malleolus, no AITFL, no posterior TFL and no head of 5th metatarsal tenderness found. Achilles tendon exhibits no pain and normal Thompson's test results.       Right foot: She exhibits normal range of motion, no tenderness, no swelling, normal capillary refill and no deformity.  Neurological: No sensory deficit.  - SLR        Assessment & Plan:  1.  Right ankle pain in a pt with hx of remote ankle fracture, mechanism appears to have been inversion injury but no evidence of instability.  Exam is unremarkable and symptoms occur mainly with wt bearing.  Xray is normal, consider intra articular cartilage defects.  Referral to Ortho.  Has been seen in past by [facility].

## 2013-08-03 NOTE — Patient Instructions (Signed)
Orthopedic eval R foot/ankle [facility]

## 2013-08-20 ENCOUNTER — Other Ambulatory Visit (HOSPITAL_COMMUNITY): Payer: Self-pay | Admitting: Family Medicine

## 2013-08-20 DIAGNOSIS — Z1231 Encounter for screening mammogram for malignant neoplasm of breast: Secondary | ICD-10-CM

## 2013-08-26 ENCOUNTER — Telehealth: Payer: Self-pay

## 2013-08-26 ENCOUNTER — Other Ambulatory Visit: Payer: Self-pay

## 2013-08-26 MED ORDER — CYCLOBENZAPRINE HCL 10 MG PO TABS: 10.0000 mg | ORAL_TABLET | ORAL | Status: DC | PRN

## 2013-08-26 NOTE — Telephone Encounter (Signed)
Refill request form [facility] for cymbalta 30mg  1 cap po bid.  Please advise.

## 2013-08-27 MED ORDER — DULOXETINE HCL 30 MG PO CPEP: 30.0000 mg | ORAL_CAPSULE | Freq: Two times a day (BID) | ORAL | Status: DC

## 2013-08-27 NOTE — Telephone Encounter (Signed)
Cymbalta Refill sent to pharmacy.

## 2013-08-27 NOTE — Telephone Encounter (Signed)
Ok to RF x 2 

## 2013-09-02 ENCOUNTER — Other Ambulatory Visit: Payer: Self-pay | Admitting: *Deleted

## 2013-09-02 MED ORDER — HYDROCODONE-ACETAMINOPHEN 5-325 MG PO TABS: 1.0000 | ORAL_TABLET | Freq: Two times a day (BID) | ORAL | Status: DC | PRN

## 2013-09-02 NOTE — Telephone Encounter (Signed)
RX printed early for controlled medication for the visit with RN on 09/04/13 (to be signed by MD) 

## 2013-09-03 ENCOUNTER — Ambulatory Visit (HOSPITAL_COMMUNITY): Payer: Medicaid Other

## 2013-09-03 ENCOUNTER — Encounter: Payer: Medicaid Other | Admitting: Physical Medicine and Rehabilitation

## 2013-09-04 ENCOUNTER — Encounter: Payer: Medicaid Other | Attending: Physical Medicine & Rehabilitation | Admitting: *Deleted

## 2013-09-04 ENCOUNTER — Encounter: Payer: Self-pay | Admitting: *Deleted

## 2013-09-04 VITALS — BP 130/50 | HR 84 | Resp 14

## 2013-09-04 DIAGNOSIS — Z5181 Encounter for therapeutic drug level monitoring: Secondary | ICD-10-CM | POA: Insufficient documentation

## 2013-09-04 DIAGNOSIS — M25579 Pain in unspecified ankle and joints of unspecified foot: Secondary | ICD-10-CM | POA: Insufficient documentation

## 2013-09-04 DIAGNOSIS — M79609 Pain in unspecified limb: Secondary | ICD-10-CM | POA: Insufficient documentation

## 2013-09-04 DIAGNOSIS — M25569 Pain in unspecified knee: Secondary | ICD-10-CM | POA: Insufficient documentation

## 2013-09-04 DIAGNOSIS — Z79899 Other long term (current) drug therapy: Secondary | ICD-10-CM | POA: Insufficient documentation

## 2013-09-04 DIAGNOSIS — M47816 Spondylosis without myelopathy or radiculopathy, lumbar region: Secondary | ICD-10-CM

## 2013-09-04 DIAGNOSIS — M545 Low back pain, unspecified: Secondary | ICD-10-CM | POA: Insufficient documentation

## 2013-09-04 DIAGNOSIS — M47817 Spondylosis without myelopathy or radiculopathy, lumbosacral region: Secondary | ICD-10-CM | POA: Insufficient documentation

## 2013-09-04 DIAGNOSIS — G8929 Other chronic pain: Secondary | ICD-10-CM

## 2013-09-04 DIAGNOSIS — F17200 Nicotine dependence, unspecified, uncomplicated: Secondary | ICD-10-CM | POA: Insufficient documentation

## 2013-09-04 NOTE — Patient Instructions (Signed)
Follow up one month with RN for med refill 

## 2013-09-04 NOTE — Progress Notes (Signed)
Here for pill count and medication refills. Hydrocodone 5-325 #60 Fill date  08/03/13  Today NV# 0  VSS    Pain level:7  Pill count appropriate.  Refill given.  No falls. Low fall risk, and handout has been previously given for fall prevention int he home. Return one month for refill with RN

## 2013-09-08 ENCOUNTER — Ambulatory Visit (HOSPITAL_COMMUNITY)
Admission: RE | Admit: 2013-09-08 | Discharge: 2013-09-08 | Disposition: A | Discharge status: Home / Self Care | Payer: Medicaid Other | Source: Ambulatory Visit | Attending: Family Medicine | Admitting: Family Medicine

## 2013-09-08 DIAGNOSIS — Z1231 Encounter for screening mammogram for malignant neoplasm of breast: Secondary | ICD-10-CM

## 2013-09-21 ENCOUNTER — Other Ambulatory Visit: Payer: Self-pay | Admitting: Physical Medicine & Rehabilitation

## 2013-09-22 ENCOUNTER — Other Ambulatory Visit: Payer: Self-pay

## 2013-09-22 MED ORDER — CLONAZEPAM 0.5 MG PO TABS: ORAL_TABLET | ORAL | Status: DC

## 2013-09-22 MED ORDER — DICLOFENAC SODIUM 1 % TD GEL: 2.0000 g | Freq: Four times a day (QID) | TRANSDERMAL | Status: DC

## 2013-10-02 ENCOUNTER — Other Ambulatory Visit: Payer: Self-pay | Admitting: *Deleted

## 2013-10-02 MED ORDER — TRAMADOL HCL 50 MG PO TABS: ORAL_TABLET | ORAL | Status: DC

## 2013-10-02 MED ORDER — HYDROCODONE-ACETAMINOPHEN 5-325 MG PO TABS: 1.0000 | ORAL_TABLET | Freq: Two times a day (BID) | ORAL | Status: DC | PRN

## 2013-10-02 NOTE — Telephone Encounter (Signed)
Rx printed for MD to signed for RN visit 10/05/13 

## 2013-10-05 ENCOUNTER — Encounter: Payer: Self-pay | Admitting: *Deleted

## 2013-10-05 ENCOUNTER — Encounter: Payer: Medicaid Other | Attending: Physical Medicine & Rehabilitation | Admitting: *Deleted

## 2013-10-05 VITALS — BP 145/76 | HR 81 | Resp 14

## 2013-10-05 DIAGNOSIS — F17200 Nicotine dependence, unspecified, uncomplicated: Secondary | ICD-10-CM | POA: Insufficient documentation

## 2013-10-05 DIAGNOSIS — M25569 Pain in unspecified knee: Secondary | ICD-10-CM | POA: Insufficient documentation

## 2013-10-05 DIAGNOSIS — M79609 Pain in unspecified limb: Secondary | ICD-10-CM | POA: Insufficient documentation

## 2013-10-05 DIAGNOSIS — M47817 Spondylosis without myelopathy or radiculopathy, lumbosacral region: Secondary | ICD-10-CM | POA: Insufficient documentation

## 2013-10-05 DIAGNOSIS — M545 Low back pain, unspecified: Secondary | ICD-10-CM | POA: Insufficient documentation

## 2013-10-05 DIAGNOSIS — Z5181 Encounter for therapeutic drug level monitoring: Secondary | ICD-10-CM | POA: Insufficient documentation

## 2013-10-05 DIAGNOSIS — M25579 Pain in unspecified ankle and joints of unspecified foot: Secondary | ICD-10-CM | POA: Insufficient documentation

## 2013-10-05 DIAGNOSIS — Z79899 Other long term (current) drug therapy: Secondary | ICD-10-CM | POA: Insufficient documentation

## 2013-10-05 DIAGNOSIS — G8929 Other chronic pain: Secondary | ICD-10-CM | POA: Insufficient documentation

## 2013-10-05 NOTE — Progress Notes (Signed)
Here for pill count and medication refills. Hydrocodone 5/325 # 60  Fill date 09/04/13    Today NV#0  Pill count is appropriate  Refills given on the hydrocodone 5/325 and Tramadol.  VSS   She is having to use the tramadol and the hydrocodone and still not getting good relief.  She is going to have to go back to see her PCP to get a referral to the ortho since the referral Dr Wynn BankerKirsteins made cannot be accepted with [facility]--it has to come from PCP.  Mehek has not had any falls though she is a moderate fall risk and has to walk on the side of her foot or foot at an awkward angle due to her ankle pain. She has been wearing a brace.  She was educated and given a handout on fall prevention in the home at last visit. She does not have an Advanced Directive in place so I have given her a packet with information on how to put one in place.

## 2013-10-05 NOTE — Patient Instructions (Signed)
Follow up with Kirsteins

## 2013-10-20 ENCOUNTER — Other Ambulatory Visit: Payer: Self-pay | Admitting: Physical Medicine & Rehabilitation

## 2013-10-29 ENCOUNTER — Telehealth: Payer: Self-pay

## 2013-10-29 MED ORDER — MELOXICAM 15 MG PO TABS: ORAL_TABLET | ORAL | Status: DC

## 2013-10-29 NOTE — Telephone Encounter (Signed)
notifed pt refilled  meloxicam

## 2013-10-29 NOTE — Telephone Encounter (Signed)
Refill request from [facility] for meloxicam 15 mg daily.  Please advise.

## 2013-10-29 NOTE — Telephone Encounter (Signed)
May refill x2 ?

## 2013-11-04 ENCOUNTER — Encounter: Payer: Self-pay | Admitting: Registered Nurse

## 2013-11-04 ENCOUNTER — Encounter: Payer: Medicaid Other | Attending: Physical Medicine & Rehabilitation | Admitting: Registered Nurse

## 2013-11-04 VITALS — BP 133/80 | HR 78 | Resp 14 | Ht 65.0 in | Wt 182.8 lb

## 2013-11-04 DIAGNOSIS — Z79899 Other long term (current) drug therapy: Secondary | ICD-10-CM

## 2013-11-04 DIAGNOSIS — G8929 Other chronic pain: Secondary | ICD-10-CM

## 2013-11-04 DIAGNOSIS — Z5181 Encounter for therapeutic drug level monitoring: Secondary | ICD-10-CM

## 2013-11-04 DIAGNOSIS — M545 Low back pain, unspecified: Secondary | ICD-10-CM

## 2013-11-04 DIAGNOSIS — M25569 Pain in unspecified knee: Secondary | ICD-10-CM | POA: Insufficient documentation

## 2013-11-04 DIAGNOSIS — M47817 Spondylosis without myelopathy or radiculopathy, lumbosacral region: Secondary | ICD-10-CM | POA: Insufficient documentation

## 2013-11-04 DIAGNOSIS — M25579 Pain in unspecified ankle and joints of unspecified foot: Secondary | ICD-10-CM

## 2013-11-04 DIAGNOSIS — F17200 Nicotine dependence, unspecified, uncomplicated: Secondary | ICD-10-CM | POA: Insufficient documentation

## 2013-11-04 DIAGNOSIS — M79609 Pain in unspecified limb: Secondary | ICD-10-CM | POA: Insufficient documentation

## 2013-11-04 MED ORDER — HYDROCODONE-ACETAMINOPHEN 5-325 MG PO TABS: 1.0000 | ORAL_TABLET | Freq: Two times a day (BID) | ORAL | Status: DC | PRN

## 2013-11-04 NOTE — Progress Notes (Signed)
Subjective:    Patient ID: Latoya Collier, female    DOB: [birth year], 53 y.o.   MRN: [id number]  HPI: [name] is a 53 year old female who returns for follow up for chronic pain and medication refill. She says her pain is in her lower back and right ankle. She rates her pain 7. Her current exercise regime is walking and stretching exercise. She has not followed up with her primary doctor to obtain a referral for orthopedist. Dr. Doroteo BradfordKirstein wrote a referral, they wouldn't accept it,  They wanted it from PMD office. Encouraged to get referral. She verbalized understanding.   Pain Inventory Average Pain 7 Pain Right Now 7 My pain is sharp, burning, dull, stabbing and aching  In the last 24 hours, has pain interfered with the following? General activity 7 Relation with others 7 Enjoyment of life 7 What TIME of day is your pain at its worst? morning and daytime Sleep (in general) Fair  Pain is worse with: walking, bending, sitting, inactivity, standing and some activites Pain improves with: medication Relief from Meds: 6  Mobility walk without assistance  Function I need assistance with the following:  household duties  Neuro/Psych No problems in this area  Prior Studies Any changes since last visit?  no  Physicians involved in your care Any changes since last visit?  no   History reviewed. No pertinent family history. History   Social History  . Marital Status: Divorced    Spouse Name: N/A    Number of Children: N/A  . Years of Education: N/A   Social History Main Topics  . Smoking status: Current Every Day Smoker -- 0.30 packs/day for 30 years  . Smokeless tobacco: Never Used     Comment: cutting back  . Alcohol Use: No     Comment: occ glass of wine  . Drug Use: No  . Sexual Activity: None   Other Topics Concern  . None   Social History Narrative  . None   Past Surgical History  Procedure Laterality Date  . Colonoscopy    . Diagnostic  mammogram     Past Medical History  Diagnosis Date  . Arthritis   . Abnormal cells of cervix   . Chronic back pain    BP 133/80  Pulse 78  Resp 14  Ht 5\' 5"  (1.651 m)  Wt 182 lb 12.8 oz (82.918 kg)  BMI 30.42 kg/m2  SpO2 99%  Opioid Risk Score:   Fall Risk Score: Low Fall Risk (0-5 points) (educated and handout given for fall prevention in the home at previous appt) Review of Systems  Musculoskeletal: Positive for back pain.  All other systems reviewed and are negative.      Objective:   Physical Exam  Nursing note and vitals reviewed. Constitutional: She is oriented to person, place, and time. She appears well-developed and well-nourished.  HENT:  Head: Normocephalic.  Neck: Normal range of motion. Neck supple.  Cardiovascular: Normal rate, regular rhythm and normal heart sounds.   Pulmonary/Chest: Effort normal and breath sounds normal.  Musculoskeletal:  Normal Muscle Bulk: Muscle testing Reveals: Upper Extremities Muscle Strength 4/5. Full ROM Lower Extremities: Right Flexion: Full ROM 5/5 Left Flexion: pain radiates to back Walks carrying right foot. Arises from chair with ease.   Antalgic Gait  Neurological: She is alert and oriented to person, place, and time.  Skin: Skin is warm and dry.  Psychiatric: She has a normal mood and affect.  Assessment & Plan:  1. Lumbar Spondylosis and Degenerative Disc Disease:  Refilled: Hydrocodone 5/325mg  one tablet BID #60 2. Right ankle pain in a pt with hx of remote ankle fracture, mechanism appears to have been inversion injury but no evidence of instability. She hasn't went to her PMD for Orthopedist referral. They wouldn't take the referral from Dr. Wynn BankerKirsteins. She was encouraged to see her PMD for Referral. She verbalizes understanding.  20 minutes of face to face patient care time was spent during this visit. All questions were encouraged and answered.  F/U in 1 month

## 2013-11-18 ENCOUNTER — Other Ambulatory Visit: Payer: Self-pay | Admitting: Physical Medicine & Rehabilitation

## 2013-11-19 ENCOUNTER — Telehealth: Payer: Self-pay | Admitting: *Deleted

## 2013-11-19 MED ORDER — HYDROCODONE-ACETAMINOPHEN 5-325 MG PO TABS: 1.0000 | ORAL_TABLET | Freq: Three times a day (TID) | ORAL | Status: DC | PRN

## 2013-11-19 NOTE — Telephone Encounter (Signed)
rx printed for Latoya Collier to sign with sig changes.  Pt aware rx will be available for pick up today.

## 2013-12-03 ENCOUNTER — Ambulatory Visit: Payer: Medicaid Other | Admitting: Registered Nurse

## 2013-12-10 ENCOUNTER — Encounter: Payer: Self-pay | Admitting: Registered Nurse

## 2013-12-10 ENCOUNTER — Encounter: Payer: Medicaid Other | Attending: Physical Medicine & Rehabilitation | Admitting: Registered Nurse

## 2013-12-10 VITALS — BP 127/85 | HR 83 | Resp 14 | Ht 65.0 in | Wt 177.0 lb

## 2013-12-10 DIAGNOSIS — M545 Low back pain, unspecified: Secondary | ICD-10-CM | POA: Insufficient documentation

## 2013-12-10 DIAGNOSIS — M25569 Pain in unspecified knee: Secondary | ICD-10-CM | POA: Insufficient documentation

## 2013-12-10 DIAGNOSIS — M47817 Spondylosis without myelopathy or radiculopathy, lumbosacral region: Secondary | ICD-10-CM | POA: Insufficient documentation

## 2013-12-10 DIAGNOSIS — F17200 Nicotine dependence, unspecified, uncomplicated: Secondary | ICD-10-CM | POA: Insufficient documentation

## 2013-12-10 DIAGNOSIS — M79609 Pain in unspecified limb: Secondary | ICD-10-CM | POA: Insufficient documentation

## 2013-12-10 DIAGNOSIS — G8929 Other chronic pain: Secondary | ICD-10-CM | POA: Insufficient documentation

## 2013-12-10 DIAGNOSIS — Z79899 Other long term (current) drug therapy: Secondary | ICD-10-CM | POA: Insufficient documentation

## 2013-12-10 DIAGNOSIS — M25579 Pain in unspecified ankle and joints of unspecified foot: Secondary | ICD-10-CM

## 2013-12-10 DIAGNOSIS — Z5181 Encounter for therapeutic drug level monitoring: Secondary | ICD-10-CM | POA: Insufficient documentation

## 2013-12-10 MED ORDER — TRAMADOL HCL 50 MG PO TABS: ORAL_TABLET | ORAL | Status: DC

## 2013-12-10 MED ORDER — HYDROCODONE-ACETAMINOPHEN 5-325 MG PO TABS: 1.0000 | ORAL_TABLET | Freq: Three times a day (TID) | ORAL | Status: DC | PRN

## 2013-12-10 NOTE — Progress Notes (Signed)
Subjective:    Patient ID: Latoya Collier, female    DOB: [birth year], 53 y.o.   MRN: [id number]  HPI: [name] is a 53 year old female who returns for follow up for chronic pain and medication refill. She says her pain is located in her lower back. Her right ankle hasn;t been bothering her as much lately. She has been performing her ankle exercises daily. She rates her pain 7. Her current exercise regime is performing stretching exercises for her her back and ankle exercises. Two weeks ago she went to her PMD Dr. [place] and they sent a referral to orthopedist, she is waiting for an appointment at this time. Pain Inventory Average Pain 8 Pain Right Now 7 My pain is sharp, dull, stabbing and aching  In the last 24 hours, has pain interfered with the following? General activity 7 Relation with others 7 Enjoyment of life 8 What TIME of day is your pain at its worst? morning, daytime Sleep (in general) Fair  Pain is worse with: walking, bending, sitting, standing and some activites Pain improves with: medication Relief from Meds: 6  Mobility walk without assistance transfers alone  Function Do you have any goals in this area?  no  Neuro/Psych trouble walking spasms depression anxiety  Prior Studies Any changes since last visit?  no  Physicians involved in your care Any changes since last visit?  no   History reviewed. No pertinent family history. History   Social History  . Marital Status: Divorced    Spouse Name: N/A    Number of Children: N/A  . Years of Education: N/A   Social History Main Topics  . Smoking status: Current Every Day Smoker -- 0.30 packs/day for 30 years  . Smokeless tobacco: Never Used     Comment: cutting back  . Alcohol Use: No     Comment: occ glass of wine  . Drug Use: No  . Sexual Activity: None   Other Topics Concern  . None   Social History Narrative  . None   Past Surgical History  Procedure Laterality Date  .  Colonoscopy    . Diagnostic mammogram     Past Medical History  Diagnosis Date  . Arthritis   . Abnormal cells of cervix   . Chronic back pain    BP 127/85  Pulse 83  Resp 14  Ht 5\' 5"  (1.651 m)  Wt 177 lb (80.287 kg)  BMI 29.45 kg/m2  SpO2 99%  Opioid Risk Score:   Fall Risk Score: Moderate Fall Risk (6-13 points) (pt educated on fall risk, brochure given to pt previously)    Review of Systems  Musculoskeletal: Positive for back pain and gait problem.  Neurological:       Spasms  Psychiatric/Behavioral: The patient is nervous/anxious.        Depression  All other systems reviewed and are negative.      Objective:   Physical Exam  Nursing note and vitals reviewed. Constitutional: She is oriented to person, place, and time. She appears well-developed and well-nourished.  HENT:  Head: Normocephalic and atraumatic.  Neck: Normal range of motion. Neck supple.  Cardiovascular: Normal rate, regular rhythm and normal heart sounds.   Pulmonary/Chest: Effort normal and breath sounds normal.  Musculoskeletal:  Normal Muscle Bulk and Muscle Testing Reveals: Upper Extremities: Full ROM and Muscle Strength 5/5 Spine Flexion 35 degrees Back without spinal or paraspinal tenderness Lower Extremities: Full ROM and Muscle Strength 5/5 Right  leg flexion produces pain in lower back mainly right side. Arises from chair with ease Narrow based gait  Neurological: She is alert and oriented to person, place, and time.  Skin: Skin is warm and dry.  Psychiatric: She has a normal mood and affect.          Assessment & Plan:  1. Lumbar Spondylosis and Degenerative Disc Disease:  Refilled: Hydrocodone 5/325mg  one tablet every 8 hours #90. 2. Right ankle pain in a pt with hx of remote ankle fracture, mechanism appears to have been inversion injury but no evidence of instability. She went to her PMD and a Orthopedist referral has been made. She is waiting for an appointment with the  orthopedist.  15 minutes of face to face patient care time was spent during this visit. All questions were encouraged and answered.  F/U in 1 month

## 2014-01-07 ENCOUNTER — Ambulatory Visit: Payer: Medicaid Other | Admitting: Registered Nurse

## 2014-01-13 ENCOUNTER — Other Ambulatory Visit: Payer: Self-pay | Admitting: Physical Medicine & Rehabilitation

## 2014-01-22 ENCOUNTER — Encounter: Payer: Self-pay | Admitting: Registered Nurse

## 2014-01-22 ENCOUNTER — Encounter: Payer: Medicaid Other | Attending: Physical Medicine & Rehabilitation | Admitting: Registered Nurse

## 2014-01-22 VITALS — BP 122/83 | HR 70 | Resp 14 | Wt 177.6 lb

## 2014-01-22 DIAGNOSIS — M545 Low back pain, unspecified: Secondary | ICD-10-CM | POA: Insufficient documentation

## 2014-01-22 DIAGNOSIS — F17200 Nicotine dependence, unspecified, uncomplicated: Secondary | ICD-10-CM | POA: Insufficient documentation

## 2014-01-22 DIAGNOSIS — M25569 Pain in unspecified knee: Secondary | ICD-10-CM | POA: Diagnosis not present

## 2014-01-22 DIAGNOSIS — Z79899 Other long term (current) drug therapy: Secondary | ICD-10-CM | POA: Insufficient documentation

## 2014-01-22 DIAGNOSIS — M25579 Pain in unspecified ankle and joints of unspecified foot: Secondary | ICD-10-CM | POA: Insufficient documentation

## 2014-01-22 DIAGNOSIS — Z5181 Encounter for therapeutic drug level monitoring: Secondary | ICD-10-CM | POA: Diagnosis not present

## 2014-01-22 DIAGNOSIS — M79609 Pain in unspecified limb: Secondary | ICD-10-CM | POA: Diagnosis not present

## 2014-01-22 DIAGNOSIS — G8929 Other chronic pain: Secondary | ICD-10-CM | POA: Diagnosis not present

## 2014-01-22 DIAGNOSIS — M25571 Pain in right ankle and joints of right foot: Secondary | ICD-10-CM

## 2014-01-22 DIAGNOSIS — M47817 Spondylosis without myelopathy or radiculopathy, lumbosacral region: Secondary | ICD-10-CM | POA: Insufficient documentation

## 2014-01-22 MED ORDER — HYDROCODONE-ACETAMINOPHEN 5-325 MG PO TABS: 1.0000 | ORAL_TABLET | Freq: Three times a day (TID) | ORAL | Status: DC | PRN

## 2014-01-22 NOTE — Progress Notes (Signed)
Subjective:    Patient ID: Latoya Collier, female    DOB: [birth year], 53 y.o.   MRN: [id number]  HPI: Ms. Latoya Boatmanammy G Osterloh is a 53 year old female who returns for follow up for chronic pain and medication refill. She says her pain is located in her lower back  (right side mainly) and right ankle. Her current exercise regime is walking and performing stretching exercises. [facility],2015, regarding her ankle.  Pain Inventory Average Pain 7 Pain Right Now 6 My pain is sharp, dull, tingling and aching  In the last 24 hours, has pain interfered with the following? General activity 7 Relation with others 7 Enjoyment of life 7 What TIME of day is your pain at its worst? morning Sleep (in general) Fair  Pain is worse with: walking, bending, sitting, standing and some activites Pain improves with: medication Relief from Meds: 6  Mobility walk without assistance  Function Do you have any goals in this area?  no  Neuro/Psych depression anxiety  Prior Studies Any changes since last visit?  no  Physicians involved in your care Any changes since last visit?  no   History reviewed. No pertinent family history. History   Social History  . Marital Status: Divorced    Spouse Name: N/A    Number of Children: N/A  . Years of Education: N/A   Social History Main Topics  . Smoking status: Current Every Day Smoker -- 0.30 packs/day for 30 years  . Smokeless tobacco: Never Used     Comment: cutting back  . Alcohol Use: No     Comment: occ glass of wine  . Drug Use: No  . Sexual Activity: None   Other Topics Concern  . None   Social History Narrative  . None   Past Surgical History  Procedure Laterality Date  . Colonoscopy    . Diagnostic mammogram     Past Medical History  Diagnosis Date  . Arthritis   . Abnormal cells of cervix   . Chronic back pain    BP 122/83  Pulse 70  Resp 14  Wt 177 lb 9.6 oz  (80.559 kg)  SpO2 98%  Opioid Risk Score:   Fall Risk Score: Moderate Fall Risk (6-13 points) (previously educated and given handout for fall prevention in the home) Review of Systems  Musculoskeletal: Positive for back pain.  Psychiatric/Behavioral: Positive for dysphoric mood. The patient is nervous/anxious.   All other systems reviewed and are negative.      Objective:   Physical Exam  Nursing note and vitals reviewed. Constitutional: She is oriented to person, place, and time. She appears well-developed and well-nourished.  HENT:  Head: Normocephalic and atraumatic.  Neck: Normal range of motion. Neck supple.  Cardiovascular: Normal rate and regular rhythm.   Pulmonary/Chest: Effort normal and breath sounds normal.  Musculoskeletal:  Normal Muscle Bulk and Muscle testing Reveals: Upper Extremities: Full ROM and Muscle Strength 5/5 Spinal Forward Flexion: 20 Degrees and Extension 30 Degrees Lumbar Paraspinal Tenderness: L-3- L-5 Arises from Chair with Ease Narrow Based Gait  Neurological: She is alert and oriented to person, place, and time.  Skin: Skin is warm and dry.  Psychiatric: She has a normal mood and affect.          Assessment & Plan:  1. Lumbar Spondylosis and Degenerative Disc Disease:  Refilled: Hydrocodone 5/325mg  one tablet every 8 hours #90.  2. Right ankle pain in  a pt with hx of remote ankle fracture: Has an Appointment with  Orthopedist on 02/08/14  15 minutes of face to face patient care time was spent during this visit. All questions were encouraged and answered.   F/U in 1 month

## 2014-02-11 ENCOUNTER — Other Ambulatory Visit: Payer: Self-pay | Admitting: Physical Medicine & Rehabilitation

## 2014-02-16 ENCOUNTER — Other Ambulatory Visit: Payer: Self-pay | Admitting: Physical Medicine & Rehabilitation

## 2014-02-22 ENCOUNTER — Encounter: Payer: Self-pay | Admitting: Registered Nurse

## 2014-02-22 ENCOUNTER — Telehealth: Payer: Self-pay | Admitting: *Deleted

## 2014-02-22 ENCOUNTER — Encounter: Payer: Medicaid Other | Attending: Physical Medicine & Rehabilitation | Admitting: Registered Nurse

## 2014-02-22 VITALS — BP 132/76 | HR 76 | Resp 14 | Wt 178.2 lb

## 2014-02-22 DIAGNOSIS — G8929 Other chronic pain: Secondary | ICD-10-CM | POA: Diagnosis not present

## 2014-02-22 DIAGNOSIS — M47817 Spondylosis without myelopathy or radiculopathy, lumbosacral region: Secondary | ICD-10-CM | POA: Insufficient documentation

## 2014-02-22 DIAGNOSIS — F17200 Nicotine dependence, unspecified, uncomplicated: Secondary | ICD-10-CM | POA: Diagnosis not present

## 2014-02-22 DIAGNOSIS — M25569 Pain in unspecified knee: Secondary | ICD-10-CM | POA: Diagnosis not present

## 2014-02-22 DIAGNOSIS — M545 Low back pain, unspecified: Secondary | ICD-10-CM | POA: Diagnosis present

## 2014-02-22 DIAGNOSIS — Z79899 Other long term (current) drug therapy: Secondary | ICD-10-CM | POA: Diagnosis not present

## 2014-02-22 DIAGNOSIS — M25579 Pain in unspecified ankle and joints of unspecified foot: Secondary | ICD-10-CM | POA: Insufficient documentation

## 2014-02-22 DIAGNOSIS — M79609 Pain in unspecified limb: Secondary | ICD-10-CM | POA: Insufficient documentation

## 2014-02-22 DIAGNOSIS — M25571 Pain in right ankle and joints of right foot: Secondary | ICD-10-CM

## 2014-02-22 DIAGNOSIS — Z5181 Encounter for therapeutic drug level monitoring: Secondary | ICD-10-CM

## 2014-02-22 MED ORDER — HYDROCODONE-ACETAMINOPHEN 5-325 MG PO TABS: 1.0000 | ORAL_TABLET | Freq: Three times a day (TID) | ORAL | Status: DC | PRN

## 2014-02-22 NOTE — Telephone Encounter (Signed)
Was in today and forgot to ask for an rx for a shower chair to assist her with taking shower.

## 2014-02-22 NOTE — Progress Notes (Signed)
Subjective:    Patient ID: Latoya Collier, female    DOB: [birth year], 53 y.o.   MRN: [id number]  HPI: Latoya Collier is a 53 year old female who returns for follow up for chronic pain and medication refill. She says her pain is located in her lower back  (right side mainly) and right ankle. Her current exercise regime is walking and performing stretching exercises.  [facility] August [facility],2015, regarding her ankle.    Pain Inventory Average Pain 7 Pain Right Now 7 My pain is sharp, burning, dull, stabbing and aching  In the last 24 hours, has pain interfered with the following? General activity 7 Relation with others 7 Enjoyment of life 7 What TIME of day is your pain at its worst? morning and daytime Sleep (in general) Fair  Pain is worse with: walking Pain improves with: medication Relief from Meds: 6  Mobility walk without assistance use a cane ability to climb steps?  yes do you drive?  yes  Function Do you have any goals in this area?  no  Neuro/Psych trouble walking depression anxiety  Prior Studies Any changes since last visit?  no  Physicians involved in your care Any changes since last visit?  no   History reviewed. No pertinent family history. History   Social History  . Marital Status: Divorced    Spouse Name: N/A    Number of Children: N/A  . Years of Education: N/A   Social History Main Topics  . Smoking status: Current Every Day Smoker -- 0.30 packs/day for 30 years  . Smokeless tobacco: Never Used     Comment: cutting back  . Alcohol Use: No     Comment: occ glass of wine  . Drug Use: No  . Sexual Activity: None   Other Topics Concern  . None   Social History Narrative  . None   Past Surgical History  Procedure Laterality Date  . Colonoscopy    . Diagnostic mammogram     Past Medical History  Diagnosis Date  . Arthritis   . Abnormal cells of cervix   . Chronic back pain     BP 132/76  Pulse 76  Resp 14  Wt 178 lb 3.2 oz (80.831 kg)  SpO2 98%  Opioid Risk Score:   Fall Risk Score: Moderate Fall Risk (6-13 points) (previously educated and given handout) Review of Systems  Musculoskeletal: Positive for gait problem.  Psychiatric/Behavioral: Positive for dysphoric mood. The patient is nervous/anxious.   All other systems reviewed and are negative.      Objective:   Physical Exam  Nursing note and vitals reviewed. Constitutional: She is oriented to person, place, and time. She appears well-developed and well-nourished.  HENT:  Head: Normocephalic and atraumatic.  Neck: Normal range of motion. Neck supple.  Cardiovascular: Normal rate, regular rhythm and normal heart sounds.   Pulmonary/Chest: Effort normal and breath sounds normal.  Musculoskeletal:  Normal Muscle Bulk and Muscle testing Reveals: Upper Extremities: Full ROM and Muscle Strength 5/5 Spinal Forward Flexion 30 Degrees and Extension 20 Degrees Back without spinal or paraspinal tenderness noted Lower Extremities: Full ROM and Muscle strength 5/5 Arises from chair with slight difficulty Antalgic gait  Neurological: She is alert and oriented to person, place, and time.  Skin: Skin is warm and dry.  Psychiatric: She has a normal mood and affect.          Assessment & Plan:  1. Lumbar Spondylosis and Degenerative Disc Disease:  Refilled: Hydrocodone 5/325mg  one tablet every 8 hours #90.  2. Right ankle pain in a pt with hx of remote ankle fracture:  Has an Appointment with [facility] on 03/04/14   15 minutes of face to face patient care time was spent during this visit. All questions were encouraged and answered.   F/U in 1 month

## 2014-02-24 NOTE — Telephone Encounter (Signed)
Order placed for shower chair to assist her in shower since she is experiencing pain with her right leg and ankle.Notified on Lorien's personally identified VM to let us know if she wants it mailed or to pick it up.

## 2014-03-01 ENCOUNTER — Telehealth: Payer: Self-pay

## 2014-03-01 NOTE — Telephone Encounter (Signed)
Patient is requesting a call back. She wants to know if she had a X Ray or a MRI on her ankle.

## 2014-03-02 NOTE — Telephone Encounter (Signed)
Left VM for Ngan to call back.  I called to inform her that he had an x-ray of her ankle and we are putting her shower chair order in the mail.

## 2014-03-03 NOTE — Telephone Encounter (Signed)
Patient returned call and message below was given to patient.

## 2014-03-23 ENCOUNTER — Encounter: Payer: Medicaid Other | Attending: Physical Medicine & Rehabilitation | Admitting: Registered Nurse

## 2014-03-23 ENCOUNTER — Encounter: Payer: Self-pay | Admitting: Registered Nurse

## 2014-03-23 VITALS — BP 126/89 | HR 75 | Resp 14 | Wt 179.2 lb

## 2014-03-23 DIAGNOSIS — G8929 Other chronic pain: Secondary | ICD-10-CM | POA: Insufficient documentation

## 2014-03-23 DIAGNOSIS — F17200 Nicotine dependence, unspecified, uncomplicated: Secondary | ICD-10-CM | POA: Diagnosis not present

## 2014-03-23 DIAGNOSIS — M545 Low back pain, unspecified: Secondary | ICD-10-CM | POA: Diagnosis present

## 2014-03-23 DIAGNOSIS — M79609 Pain in unspecified limb: Secondary | ICD-10-CM | POA: Diagnosis not present

## 2014-03-23 DIAGNOSIS — M25569 Pain in unspecified knee: Secondary | ICD-10-CM | POA: Diagnosis not present

## 2014-03-23 DIAGNOSIS — Z79899 Other long term (current) drug therapy: Secondary | ICD-10-CM | POA: Diagnosis not present

## 2014-03-23 DIAGNOSIS — Z5181 Encounter for therapeutic drug level monitoring: Secondary | ICD-10-CM | POA: Diagnosis not present

## 2014-03-23 DIAGNOSIS — M25579 Pain in unspecified ankle and joints of unspecified foot: Secondary | ICD-10-CM | POA: Insufficient documentation

## 2014-03-23 DIAGNOSIS — M47817 Spondylosis without myelopathy or radiculopathy, lumbosacral region: Secondary | ICD-10-CM | POA: Diagnosis not present

## 2014-03-23 DIAGNOSIS — M25571 Pain in right ankle and joints of right foot: Secondary | ICD-10-CM

## 2014-03-23 MED ORDER — HYDROCODONE-ACETAMINOPHEN 5-325 MG PO TABS: 1.0000 | ORAL_TABLET | Freq: Three times a day (TID) | ORAL | Status: DC | PRN

## 2014-03-23 NOTE — Progress Notes (Signed)
Subjective:    Patient ID: Latoya Collier, female    DOB: December 27, 1960, 53 y.o.   MRN: [id number]  HPI: [name] is a 53 year old female who returns for follow up for chronic pain and medication refill. She says her pain is located in her lower back  and right ankle. Her usual exercise regime is walking and performing stretching exercises. She hasn't been following her exercise regime due to increase pain in her lower back. She says she will resume her exercise regime this week. She went to [facility] and they ordered a MRI of her right foot. She had the MRI on 03/22/2014. Her follow up appointment with Orthopedics is  next week.  She says a few weeks ago she fell in her home. She was walking down the hallway and started having shooting pains in her right foot, she tried to sit down. She lost her balanced and landed on her left hip on the floor. She was able to get herself up and rested. She didn't seek medical attention.  Pain Inventory Average Pain 7 Pain Right Now 7 My pain is sharp, burning, dull, stabbing and aching  In the last 24 hours, has pain interfered with the following? General activity 0 Relation with others 0 Enjoyment of life 0 What TIME of day is your pain at its worst? morning Sleep (in general) Fair  Pain is worse with: some activites Pain improves with: medication Relief from Meds: 5  Mobility how many minutes can you walk? 5 ability to climb steps?  yes do you drive?  yes  Function disabled: date disabled .  Neuro/Psych No problems in this area  Prior Studies Any changes since last visit?  no  Physicians involved in your care Any changes since last visit?  no   History reviewed. No pertinent family history. History   Social History  . Marital Status: Divorced    Spouse Name: N/A    Number of Children: N/A  . Years of Education: N/A   Social History Main Topics  . Smoking status: Current Every Day Smoker -- 0.30  packs/day for 30 years  . Smokeless tobacco: Never Used     Comment: cutting back  . Alcohol Use: No     Comment: occ glass of wine  . Drug Use: No  . Sexual Activity: None   Other Topics Concern  . None   Social History Narrative  . None   Past Surgical History  Procedure Laterality Date  . Colonoscopy    . Diagnostic mammogram     Past Medical History  Diagnosis Date  . Arthritis   . Abnormal cells of cervix   . Chronic back pain    BP 126/89  Pulse 75  Resp 14  Wt 179 lb 3.2 oz (81.285 kg)  SpO2 100%  Opioid Risk Score:   Fall Risk Score: Moderate Fall Risk (6-13 points) (previoulsy educated and given handout) Review of Systems  Musculoskeletal: Positive for back pain and gait problem.  All other systems reviewed and are negative.      Objective:   Physical Exam  Nursing note and vitals reviewed. Constitutional: She is oriented to person, place, and time. She appears well-developed and well-nourished.  HENT:  Head: Normocephalic and atraumatic.  Neck: Normal range of motion. Neck supple.  Cardiovascular: Normal rate and regular rhythm.   Pulmonary/Chest: Effort normal and breath sounds normal.  Musculoskeletal:  Normal Muscle Bulk and Muscle Testing Reveals: Upper Extremities:  Full ROM and Muscle Strength 5/5 Lumbar Paraspinal Tenderness: L:3- L-4 Lower Extremities: Full ROM and Muscle Strength 5/5 Right Leg Flexion Produces Pain into Lumbar Arises from chair with ease Narrow Based Gait   Neurological: She is alert and oriented to person, place, and time.  Skin: Skin is warm and dry.  Psychiatric: She has a normal mood and affect.          Assessment & Plan:  1. Lumbar Spondylosis and Degenerative Disc Disease:  Refilled: Hydrocodone 5/325mg  one tablet every 8 hours #90.  2. Right ankle pain with hx of remote ankle fracture:  Was seen at [facility]. MRI was ordered. Awaiting Results of MRI: Ortho Following  15 minutes of face to  face patient care time was spent during this visit. All questions were encouraged and answered.   F/U in 1 month

## 2014-04-14 ENCOUNTER — Other Ambulatory Visit: Payer: Self-pay | Admitting: Physical Medicine & Rehabilitation

## 2014-04-20 ENCOUNTER — Ambulatory Visit: Payer: Medicaid Other | Admitting: Registered Nurse

## 2014-04-23 ENCOUNTER — Encounter: Payer: Medicaid Other | Attending: Physical Medicine & Rehabilitation | Admitting: Registered Nurse

## 2014-04-23 ENCOUNTER — Encounter: Payer: Self-pay | Admitting: Registered Nurse

## 2014-04-23 VITALS — BP 134/80 | HR 86 | Resp 14 | Ht 65.0 in | Wt 177.0 lb

## 2014-04-23 DIAGNOSIS — M545 Low back pain, unspecified: Secondary | ICD-10-CM

## 2014-04-23 DIAGNOSIS — M25571 Pain in right ankle and joints of right foot: Secondary | ICD-10-CM

## 2014-04-23 DIAGNOSIS — G894 Chronic pain syndrome: Secondary | ICD-10-CM | POA: Diagnosis present

## 2014-04-23 DIAGNOSIS — G8929 Other chronic pain: Secondary | ICD-10-CM

## 2014-04-23 DIAGNOSIS — Z79899 Other long term (current) drug therapy: Secondary | ICD-10-CM | POA: Diagnosis not present

## 2014-04-23 DIAGNOSIS — Z5181 Encounter for therapeutic drug level monitoring: Secondary | ICD-10-CM | POA: Diagnosis present

## 2014-04-23 MED ORDER — HYDROCODONE-ACETAMINOPHEN 5-325 MG PO TABS: 1.0000 | ORAL_TABLET | Freq: Three times a day (TID) | ORAL | Status: DC | PRN

## 2014-04-23 NOTE — Progress Notes (Signed)
Subjective:    Patient ID: Latoya Collier, female    DOB: 03/13/1961, 53 y.o.   MRN: [id number]  HPI: Latoya Collier is a 53 year old female who returns for follow up for chronic pain and medication refill. She says her pain is located in her lower back. She rates her pain 7. Hercurrent exercise regime is walking and performing stretching exercises.   Pain Inventory Average Pain 7 Pain Right Now 7 My pain is constant, sharp, burning, dull, stabbing and aching  In the last 24 hours, has pain interfered with the following? General activity 6 Relation with others 6 Enjoyment of life 6 What TIME of day is your pain at its worst? All Sleep (in general) Fair  Pain is worse with: walking, bending, sitting, inactivity, standing and some activites Pain improves with: medication Relief from Meds: 6  Mobility walk without assistance how many minutes can you walk? 10 mintues ability to climb steps?  yes do you drive?  yes  Function disabled: date disabled .  Neuro/Psych depression anxiety  Prior Studies Any changes since last visit?  no  Physicians involved in your care Any changes since last visit?  no   No family history on file. History   Social History  . Marital Status: Divorced    Spouse Name: N/A    Number of Children: N/A  . Years of Education: N/A   Social History Main Topics  . Smoking status: Current Every Day Smoker -- 0.30 packs/day for 30 years  . Smokeless tobacco: Never Used     Comment: cutting back  . Alcohol Use: No     Comment: occ glass of wine  . Drug Use: No  . Sexual Activity: Not on file   Other Topics Concern  . Not on file   Social History Narrative  . No narrative on file   Past Surgical History  Procedure Laterality Date  . Colonoscopy    . Diagnostic mammogram     Past Medical History  Diagnosis Date  . Arthritis   . Abnormal cells of cervix   . Chronic back pain    BP 134/80  Pulse 86  Resp 14  Ht 5\' 5"  (1.651  m)  Wt 177 lb (80.287 kg)  BMI 29.45 kg/m2  SpO2 100%  Opioid Risk Score:   Fall Risk Score: Low Fall Risk (0-5 points)   Review of Systems     Objective:   Physical Exam  Vitals reviewed. Constitutional: She is oriented to person, place, and time. She appears well-developed and well-nourished.  HENT:  Head: Normocephalic and atraumatic.  Neck: Normal range of motion. Neck supple.  Cardiovascular: Normal rate and regular rhythm.   Pulmonary/Chest: Effort normal and breath sounds normal.  Musculoskeletal:  Normal Muscle Bulk and Muscle Testing Reveals: Upper Extremities: Full ROM and Muscle Strength 5/5 Lumbar Paraspinal Tenderness: L-3- L-5 Lower Extremities: Full ROM and Muscle Strength 5/5 Right Leg Flexion Produces pain into Lumbar Arises from table with ease Narrow Based Gait  Neurological: She is alert and oriented to person, place, and time.  Skin: Skin is warm and dry.  Psychiatric: She has a normal mood and affect.          Assessment & Plan:  1. Lumbar Spondylosis and Degenerative Disc Disease:  Refilled: Hydrocodone 5/325mg  one tablet every 8 hours #90.  2. Right ankle pain with hx of remote ankle fracture:  [facility] MRI was ordered. Ortho Following  15 minutes of face to face patient care time was spent during this visit. All questions were encouraged and answered.  F/U in 1 month

## 2014-04-29 ENCOUNTER — Other Ambulatory Visit (HOSPITAL_COMMUNITY): Payer: Self-pay | Admitting: Family Medicine

## 2014-04-29 DIAGNOSIS — M5136 Other intervertebral disc degeneration, lumbar region: Secondary | ICD-10-CM

## 2014-05-10 ENCOUNTER — Other Ambulatory Visit: Payer: Self-pay | Admitting: Physical Medicine & Rehabilitation

## 2014-05-11 ENCOUNTER — Ambulatory Visit (HOSPITAL_COMMUNITY): Admission: RE | Admit: 2014-05-11 | Payer: Medicaid Other | Source: Ambulatory Visit

## 2014-05-19 ENCOUNTER — Ambulatory Visit (HOSPITAL_COMMUNITY): Admission: RE | Admit: 2014-05-19 | Payer: Medicaid Other | Source: Ambulatory Visit

## 2014-05-21 ENCOUNTER — Ambulatory Visit: Payer: Medicaid Other | Admitting: Physical Medicine & Rehabilitation

## 2014-05-24 ENCOUNTER — Encounter: Payer: Self-pay | Admitting: Physical Medicine & Rehabilitation

## 2014-05-24 ENCOUNTER — Ambulatory Visit (HOSPITAL_BASED_OUTPATIENT_CLINIC_OR_DEPARTMENT_OTHER): Payer: Medicaid Other | Admitting: Physical Medicine & Rehabilitation

## 2014-05-24 ENCOUNTER — Encounter: Payer: Medicaid Other | Attending: Physical Medicine & Rehabilitation

## 2014-05-24 VITALS — BP 129/79 | HR 78 | Resp 14 | Wt 175.8 lb

## 2014-05-24 DIAGNOSIS — G894 Chronic pain syndrome: Secondary | ICD-10-CM | POA: Diagnosis present

## 2014-05-24 DIAGNOSIS — M47816 Spondylosis without myelopathy or radiculopathy, lumbar region: Secondary | ICD-10-CM

## 2014-05-24 DIAGNOSIS — Z79899 Other long term (current) drug therapy: Secondary | ICD-10-CM | POA: Diagnosis present

## 2014-05-24 DIAGNOSIS — Z5181 Encounter for therapeutic drug level monitoring: Secondary | ICD-10-CM | POA: Diagnosis present

## 2014-05-24 DIAGNOSIS — M4726 Other spondylosis with radiculopathy, lumbar region: Secondary | ICD-10-CM

## 2014-05-24 MED ORDER — HYDROCODONE-ACETAMINOPHEN 5-325 MG PO TABS: 1.0000 | ORAL_TABLET | Freq: Three times a day (TID) | ORAL | Status: DC | PRN

## 2014-05-24 NOTE — Patient Instructions (Signed)
Will try the lumbar injections for arthritis Right L3-4-5

## 2014-05-24 NOTE — Progress Notes (Signed)
Subjective:    Patient ID: Latoya Collier, female    DOB: 10/05/1960, 53 y.o.   MRN: [id number]  HPI   Seen by Dr Victorino DikeHewitt regarding ankle, planning a series of injection and possibly surgery  Chief complaint today low back pain  Low back pain is mainly right-sided, no radiating pain down to the leg. No new trauma to that area. Has seen primary physician who has discussed possibly getting another MRI or sending her to neurosurgery  Patient would like to avoid surgery if possible Pain Inventory Average Pain 8 Pain Right Now 6 My pain is constant, sharp, burning, dull, stabbing and aching  In the last 24 hours, has pain interfered with the following? General activity 7 Relation with others 7 Enjoyment of life 7 What TIME of day is your pain at its worst? morning Sleep (in general) Fair  Pain is worse with: bending, sitting and standing Pain improves with: rest and medication Relief from Meds: 6  Mobility walk without assistance  Function Do you have any goals in this area?  no  Neuro/Psych No problems in this area  Prior Studies Any changes since last visit?  no  Physicians involved in your care Any changes since last visit?  no   History reviewed. No pertinent family history. History   Social History  . Marital Status: Divorced    Spouse Name: N/A    Number of Children: N/A  . Years of Education: N/A   Social History Main Topics  . Smoking status: Current Every Day Smoker -- 0.30 packs/day for 30 years  . Smokeless tobacco: Never Used     Comment: cutting back  . Alcohol Use: No     Comment: occ glass of wine  . Drug Use: No  . Sexual Activity: None   Other Topics Concern  . None   Social History Narrative   Past Surgical History  Procedure Laterality Date  . Colonoscopy    . Diagnostic mammogram     Past Medical History  Diagnosis Date  . Arthritis   . Abnormal cells of cervix   . Chronic back pain    BP 129/79 mmHg  Pulse 78  Resp  14  Wt 175 lb 12.8 oz (79.742 kg)  SpO2 98%  Opioid Risk Score:   Fall Risk Score: Low Fall Risk (0-5 points) (previoulsy educated and given handout on fall prevention)  Review of Systems  Musculoskeletal: Positive for back pain.  All other systems reviewed and are negative.      Objective:   Physical Exam Decreased sensation right L3 dermatome to pinprick Motor strength is 5/5 bilateral hip flexor and knee extensor ankle dorsal flexor plantar flexor Back has mild tenderness palpation lumbar paraspinals starting at L3-S1 area. Negative straight leg raising Name femoral stretch test Gait is normal Spine without evidence of deformity. Hips with good range of motion bilaterally      Assessment & Plan:  1. Lumbar pain primarily axial and on the right side. She does have some numbness in the L3 dermatome corresponding with her MRI findings of right L3 foraminal stenosis  Point her primary complaint is right-sided low back pain, she also has evidence of facet arthropathy on MRI. Recommend right-sided L3-L4 medial branch and L5 dorsal ramus injection under fluoroscopic guidance. Discussed with patient agrees with plan. Should this fail would consider L3 transforaminal injection and failing that she may benefit from another MRI  Continue hydrocodone5 mg 3 times a day as well as  tramadol 50 mg 3 times a day In addition she is on several ball to 30 g a day as well as meloxicam 15 mg a day

## 2014-06-22 ENCOUNTER — Encounter: Payer: Medicaid Other | Attending: Physical Medicine & Rehabilitation

## 2014-06-22 ENCOUNTER — Ambulatory Visit (HOSPITAL_BASED_OUTPATIENT_CLINIC_OR_DEPARTMENT_OTHER): Payer: Medicaid Other | Admitting: Physical Medicine & Rehabilitation

## 2014-06-22 ENCOUNTER — Encounter: Payer: Self-pay | Admitting: Physical Medicine & Rehabilitation

## 2014-06-22 VITALS — BP 121/76 | HR 78 | Resp 14

## 2014-06-22 DIAGNOSIS — G894 Chronic pain syndrome: Secondary | ICD-10-CM

## 2014-06-22 DIAGNOSIS — Z5181 Encounter for therapeutic drug level monitoring: Secondary | ICD-10-CM | POA: Diagnosis present

## 2014-06-22 DIAGNOSIS — Z79899 Other long term (current) drug therapy: Secondary | ICD-10-CM

## 2014-06-22 DIAGNOSIS — M47816 Spondylosis without myelopathy or radiculopathy, lumbar region: Secondary | ICD-10-CM

## 2014-06-22 MED ORDER — HYDROCODONE-ACETAMINOPHEN 5-325 MG PO TABS: 1.0000 | ORAL_TABLET | Freq: Three times a day (TID) | ORAL | Status: DC | PRN

## 2014-06-22 NOTE — Progress Notes (Signed)
Right lumbar L3, L4 medial branch blocks and L5 dorsal ramus injection under fluoroscopic guidance  Indication: Right Lumbar pain which is not relieved by medication management or other conservative care and interfering with self-care and mobility.  Informed consent was obtained after describing risks and benefits of the procedure with the patient, this includes bleeding, bruising, infection, paralysis and medication side effects. The patient wishes to proceed and has given written consent. The patient was placed in a prone position. The lumbar area was marked and prepped with Betadine. One ML of 1% lidocaine was injected into each of 3 areas into the skin and subcutaneous tissue. Then a 22-gauge 3.5 inch spinal needle was inserted targeting the junction of the Right S1 superior articular process and sacral ala junction. Needle was advanced under fluoroscopic guidance. Bone contact was made. Omnipaque 180 was injected x0.5 mL demonstrating no intravascular uptake. Then a solution containing one ML of 4 mg per mL dexamethasone and 3 mL of 2% MPF lidocaine was injected x0.5 mL. Then the Right L5 superior articular process in transverse process junction was targeted. Bone contact was made. Omnipaque 180 was injected x0.5 mL demonstrating no intravascular uptake. Then a solution containing one ML of 4 mg per mL dexamethasone and 3 mL of 2% MPF lidocaine was injected x0.5 mL. Then the Right L4 superior articular process in transverse process junction was targeted. Bone contact was made. Omnipaque 180 was injected x0.5 mL demonstrating no intravascular uptake. Then a solution containing one ML of 4 mg per mL dexamethasone and 3 mL of 2% MPF lidocaine was injected x0.5 mL Patient tolerated procedure well. Post procedure instructions were given. Please refer to post procedure form. 

## 2014-06-22 NOTE — Patient Instructions (Signed)

## 2014-06-22 NOTE — Progress Notes (Signed)
  PROCEDURE RECORD [facility]   Name: Latoya Collier DOB:February 22, 1961 MRN: [id number]  Date:06/22/2014  Physician: Claudette LawsAndrew Kirsteins, MD    Nurse/CMA: Suezanne JacquetSybil Shumaker  Allergies:  Allergies  Allergen Reactions  . Wellbutrin [Bupropion Hcl] Rash    Consent Signed: Yes.    Is patient diabetic? No.  CBG today? .  Pregnant: No. LMP: No LMP recorded. Patient is postmenopausal. (age 53-55)  Anticoagulants: no Anti-inflammatory: no Antibiotics: no  Procedure: MBBB Right L3, 4 &5  Position: Prone Start Time:  1:26 End Time: 1:34 Fluoro Time: 21 sec  RN/CMA Wrenna Saks Sybil Shumaker    Time 1:11 138    BP 121/76 142/66    Pulse 78 78    Respirations 14 14    O2 Sat 100 99    S/S 6 6    Pain Level 5/10 1/10     D/C home with friend , patient A & O X 3, D/C instructions reviewed, and sits independently.

## 2014-07-07 ENCOUNTER — Other Ambulatory Visit: Payer: Self-pay | Admitting: Registered Nurse

## 2014-07-07 ENCOUNTER — Other Ambulatory Visit: Payer: Self-pay | Admitting: Physical Medicine & Rehabilitation

## 2014-07-07 ENCOUNTER — Other Ambulatory Visit: Payer: Self-pay | Admitting: *Deleted

## 2014-07-07 NOTE — Telephone Encounter (Signed)
recvd request for Klonopin refill - called into [facility]

## 2014-07-20 ENCOUNTER — Ambulatory Visit: Payer: Medicaid Other | Admitting: Physical Medicine & Rehabilitation

## 2014-07-23 ENCOUNTER — Telehealth: Payer: Self-pay | Admitting: *Deleted

## 2014-07-23 ENCOUNTER — Encounter: Payer: Medicaid Other | Attending: Physical Medicine & Rehabilitation | Admitting: Registered Nurse

## 2014-07-23 DIAGNOSIS — G894 Chronic pain syndrome: Secondary | ICD-10-CM | POA: Insufficient documentation

## 2014-07-23 DIAGNOSIS — Z5181 Encounter for therapeutic drug level monitoring: Secondary | ICD-10-CM | POA: Insufficient documentation

## 2014-07-23 DIAGNOSIS — Z79899 Other long term (current) drug therapy: Secondary | ICD-10-CM | POA: Insufficient documentation

## 2014-07-23 NOTE — Telephone Encounter (Signed)
Pt called expressing remorse that she slept through her morning appt, and apologizes for not making it to her reschedule at 11 am, she missed her bus. Pt is now aware of a 50 dollar no show fee and says she has no way of paying for that and is asking for leniency

## 2014-07-28 NOTE — Telephone Encounter (Signed)
Let Latoya Collier know that we can bill her for the No Show fee but it will have to be paid over the next couple of months. She can set up a payment plan with the billing department.

## 2014-07-29 ENCOUNTER — Encounter: Payer: Self-pay | Admitting: Physical Medicine & Rehabilitation

## 2014-07-29 ENCOUNTER — Ambulatory Visit (HOSPITAL_BASED_OUTPATIENT_CLINIC_OR_DEPARTMENT_OTHER): Payer: Medicaid Other | Admitting: Physical Medicine & Rehabilitation

## 2014-07-29 VITALS — BP 121/60 | HR 80 | Resp 14

## 2014-07-29 DIAGNOSIS — G894 Chronic pain syndrome: Secondary | ICD-10-CM | POA: Diagnosis present

## 2014-07-29 DIAGNOSIS — M47816 Spondylosis without myelopathy or radiculopathy, lumbar region: Secondary | ICD-10-CM

## 2014-07-29 DIAGNOSIS — Z5181 Encounter for therapeutic drug level monitoring: Secondary | ICD-10-CM | POA: Diagnosis present

## 2014-07-29 DIAGNOSIS — Z79899 Other long term (current) drug therapy: Secondary | ICD-10-CM | POA: Diagnosis present

## 2014-07-29 MED ORDER — HYDROCODONE-ACETAMINOPHEN 5-325 MG PO TABS: 1.0000 | ORAL_TABLET | Freq: Three times a day (TID) | ORAL | Status: DC | PRN

## 2014-07-29 NOTE — Telephone Encounter (Signed)
Pt came in today and paid her 50 dollar no show fee plus her co-pay for today

## 2014-07-29 NOTE — Progress Notes (Signed)
Right lumbar L2, L3, L4 medial branch blocks and L5 dorsal ramus injection under fluoroscopic guidance  Indication: Right Lumbar pain which is not relieved by medication management or other conservative care and interfering with self-care and mobility.  Informed consent was obtained after describing risks and benefits of the procedure with the patient, this includes bleeding, bruising, infection, paralysis and medication side effects. The patient wishes to proceed and has given written consent. The patient was placed in a prone position. The lumbar area was marked and prepped with Betadine. One ML of 1% lidocaine was injected into each of 3 areas into the skin and subcutaneous tissue. Then a 22-gauge 3.5 inch spinal needle was inserted targeting the junction of the Right S1 superior articular process and sacral ala junction. Needle was advanced under fluoroscopic guidance. Bone contact was made. Omnipaque 180 was injected x0.5 mL demonstrating no intravascular uptake. Then a solution containing one ML of 4 mg per mL dexamethasone and 3 mL of 2% MPF lidocaine was injected x0.5 mL. Then the Right L5 superior articular process in transverse process junction was targeted. Bone contact was made. Omnipaque 180 was injected x0.5 mL demonstrating no intravascular uptake. Then a solution containing one ML of 4 mg per mL dexamethasone and 3 mL of 2% MPF lidocaine was injected x0.5 mL. Then the Right L4 superior articular process in transverse process junction was targeted. Bone contact was made. Omnipaque 180 was injected x0.5 mL demonstrating no intravascular uptake. Then a solution containing one ML of 4 mg per mL dexamethasone and 3 mL of 2% MPF lidocaine was injected x0.5 mL . Then the Right L3 superior articular process in transverse process junction was targeted. Bone contact was made. Omnipaque 180 was injected x0.5 mL demonstrating no intravascular uptake. Then a solution containing one ML of 4 mg per mL  dexamethasone and 3 mL of 2% MPF lidocaine was injected x0.5 mL Patient tolerated procedure well. Post procedure instructions were given. Please refer to post procedure form.

## 2014-07-29 NOTE — Progress Notes (Signed)
  PROCEDURE RECORD [name]   Name: Latoya Collier DOB:Oct 09, 1960 MRN: [id number]  Date:07/29/2014  Physician: Claudette LawsAndrew Kirsteins, MD    Nurse/CMA:Ken Curry Dulski, CMA  Allergies:  Allergies  Allergen Reactions  . Wellbutrin [Bupropion Hcl] Rash    Consent Signed: Yes.    Is patient diabetic? No.  CBG today?   Pregnant: No. LMP: No LMP recorded. Patient is postmenopausal. (age 54-55)  Anticoagulants: no Anti-inflammatory: no Antibiotics: no  Procedure: MBB  Position: Prone Start Time:1:24pm  End Time: 1:33pm  Fluoro Time:32  RN/CMA *Teresa CoombsKen Lundynn Cohoon Ken Elsia [place]    Time 1:00pm 1:38 pm    BP 121/60 139/68    Pulse 80 76    Respirations 14 14    O2 Sat 98 98    S/S 6 6    Pain Level 7/10 0/10     D/C home with Latoya LienJohn Collier, patient A & O X 3, D/C instructions reviewed, and sits independently.

## 2014-07-29 NOTE — Patient Instructions (Signed)

## 2014-08-06 ENCOUNTER — Other Ambulatory Visit: Payer: Self-pay | Admitting: Registered Nurse

## 2014-08-09 ENCOUNTER — Telehealth: Payer: Self-pay

## 2014-08-09 NOTE — Telephone Encounter (Signed)
Latoya Collier [phone]  Zamaria left message over weekend that she is in extreme pain since last injection, she is having trouble walking, she is also having trouble sleeping . Taking all her medicine nothing helping. Please call her, she is wondering if there is anything that Dr Wynn BankerKIrsteins can do.

## 2014-08-10 NOTE — Telephone Encounter (Signed)
May try a different muscle relaxer robaxin 500mg  po QID x 10 days #40 If pt would like I can re evaluate her

## 2014-08-11 ENCOUNTER — Other Ambulatory Visit: Payer: Self-pay | Admitting: Registered Nurse

## 2014-08-11 MED ORDER — METHOCARBAMOL 500 MG PO TABS: 500.0000 mg | ORAL_TABLET | Freq: Four times a day (QID) | ORAL | Status: DC

## 2014-08-11 NOTE — Telephone Encounter (Signed)
Patient called and left another message.  See doctors response - can rx some muscle relaxer's. Called patient and left message - [facility], she can call back and move up appt to see AK sooner

## 2014-08-12 ENCOUNTER — Other Ambulatory Visit: Payer: Self-pay | Admitting: Registered Nurse

## 2014-08-13 ENCOUNTER — Telehealth: Payer: Self-pay | Admitting: Physical Medicine & Rehabilitation

## 2014-08-13 NOTE — Telephone Encounter (Signed)
Called Elanah at [phone] and LM for pt to call back.

## 2014-08-13 NOTE — Telephone Encounter (Signed)
[facility] and they did not receive a refill request for her muscle relaxer.  She then check with [facility] and they had 10 pills for her and this will not get her through the weekend.  Needs more and wants it [facility] [address].

## 2014-08-16 NOTE — Telephone Encounter (Signed)
May call in Flexeril 10 mg twice a day #60 one refill

## 2014-08-16 NOTE — Telephone Encounter (Signed)
Latoya Collier returned call °

## 2014-08-16 NOTE — Telephone Encounter (Signed)
Latoya Collier was calling about the methocarbamol not her hydrocodone.  She says though that she took it as prescribed and he only gave her 10 and it really didn't seem to make a difference.  She did not get pain relief from the injection. She has flexeril but only enough for one a day and doesn't know if he would increase that medication.  She sees Dr Wynn BankerKirsteins on 08/26/14

## 2014-08-17 MED ORDER — CYCLOBENZAPRINE HCL 10 MG PO TABS: 10.0000 mg | ORAL_TABLET | Freq: Two times a day (BID) | ORAL | Status: DC

## 2014-08-17 NOTE — Telephone Encounter (Signed)
Called patient and left message that [facility]. If she should have any more questions please give us a call

## 2014-08-26 ENCOUNTER — Ambulatory Visit (HOSPITAL_BASED_OUTPATIENT_CLINIC_OR_DEPARTMENT_OTHER): Payer: Medicaid Other | Admitting: Physical Medicine & Rehabilitation

## 2014-08-26 ENCOUNTER — Encounter: Payer: Medicaid Other | Attending: Physical Medicine & Rehabilitation

## 2014-08-26 ENCOUNTER — Other Ambulatory Visit: Payer: Self-pay | Admitting: Physical Medicine & Rehabilitation

## 2014-08-26 ENCOUNTER — Encounter: Payer: Self-pay | Admitting: Physical Medicine & Rehabilitation

## 2014-08-26 VITALS — BP 147/90 | HR 88 | Resp 14

## 2014-08-26 DIAGNOSIS — Z5181 Encounter for therapeutic drug level monitoring: Secondary | ICD-10-CM

## 2014-08-26 DIAGNOSIS — M25511 Pain in right shoulder: Secondary | ICD-10-CM

## 2014-08-26 DIAGNOSIS — G894 Chronic pain syndrome: Secondary | ICD-10-CM | POA: Diagnosis present

## 2014-08-26 DIAGNOSIS — M533 Sacrococcygeal disorders, not elsewhere classified: Secondary | ICD-10-CM

## 2014-08-26 DIAGNOSIS — Z79899 Other long term (current) drug therapy: Secondary | ICD-10-CM | POA: Diagnosis present

## 2014-08-26 DIAGNOSIS — M47816 Spondylosis without myelopathy or radiculopathy, lumbar region: Secondary | ICD-10-CM

## 2014-08-26 MED ORDER — HYDROCODONE-ACETAMINOPHEN 5-325 MG PO TABS: 1.0000 | ORAL_TABLET | Freq: Three times a day (TID) | ORAL | Status: DC | PRN

## 2014-08-26 NOTE — Progress Notes (Signed)
  PROCEDURE RECORD [facility]   Name: Latoya Collier DOB:28-Jun-1961 MRN: [id number]  Date:08/26/2014  Physician: Claudette LawsAndrew Kirsteins, MD    Nurse/CMA: Shumaker RN  Allergies:  Allergies  Allergen Reactions  . Wellbutrin [Bupropion Hcl] Rash    Consent Signed: No.  Is patient diabetic? No.  CBG today?   Pregnant: No. LMP: No LMP recorded. Patient is postmenopausal. (age 54-55)  Anticoagulants: no Anti-inflammatory: no Antibiotics: no  Procedure: Bilateral Sacroiliac steroid injection Position: Prone Start Time: 1:04 End Time: 1:11  Fluoro Time: 17 seconds  RN/CMA Designer, multimediahumaker RN Shumaker RN    Time 12:29 1:15    BP 147/90 133/77    Pulse 88 86    Respirations 14 14    O2 Sat 100 99    S/S 6 6    Pain Level 8/10 0/10     D/C home with  Family member, patient A & O X 3, D/C instructions reviewed, and sits independently.

## 2014-08-26 NOTE — Progress Notes (Signed)
Subjective:    Patient ID: Latoya Collier, female    DOB: May 10, 1961, 54 y.o.   MRN: [id number]  HPI Patient returns today she states she had less than 50% relief for one day after Right L2 L3 L4 medial branch blocks L5 dorsal ramus injection Performed on 07/29/2014. Her pain is in the buttock area,Pain worse with rest but occurs with sitting as well. No radiating pain in the lower extremities. No lower extremity weakness or numbness No bowel or bladder dysfunction  Pain Inventory Average Pain 8 Pain Right Now 7 My pain is sharp, burning, stabbing and aching  In the last 24 hours, has pain interfered with the following? General activity 7 Relation with others 8 Enjoyment of life 8 What TIME of day is your pain at its worst? varies Sleep (in general) Fair  Pain is worse with: bending, sitting, standing and some activites Pain improves with: rest and medication Relief from Meds: 5  Mobility walk without assistance ability to climb steps?  yes do you drive?  yes  Function disabled: date disabled .  Neuro/Psych trouble walking  Prior Studies Any changes since last visit?  no  Physicians involved in your care Any changes since last visit?  no   History reviewed. No pertinent family history. History   Social History  . Marital Status: Divorced    Spouse Name: N/A  . Number of Children: N/A  . Years of Education: N/A   Social History Main Topics  . Smoking status: Current Every Day Smoker -- 0.30 packs/day for 30 years  . Smokeless tobacco: Never Used     Comment: cutting back  . Alcohol Use: No     Comment: occ glass of wine  . Drug Use: No  . Sexual Activity: Not on file   Other Topics Concern  . None   Social History Narrative   Past Surgical History  Procedure Laterality Date  . Colonoscopy    . Diagnostic mammogram     Past Medical History  Diagnosis Date  . Arthritis   . Abnormal cells of cervix   . Chronic back pain    BP 147/90 mmHg   Pulse 88  Resp 14  SpO2 100%  Opioid Risk Score:   Fall Risk Score: Moderate Fall Risk (6-13 points) (previously educated and given handout) Review of Systems  Musculoskeletal: Positive for gait problem.       Objective:   Physical Exam  Constitutional: She is oriented to person, place, and time. She appears well-developed and well-nourished.  HENT:  Head: Normocephalic and atraumatic.  Eyes: Conjunctivae are normal. Pupils are equal, round, and reactive to light.  Neck: Normal range of motion.  Musculoskeletal:  Right shoulder positive impingement sign, No significant pain with range of motion including internal/external rotation Has full range of motion No tenderness in the subacromial area or over the before meals joint.  Neurological: She is alert and oriented to person, place, and time. No sensory deficit. Gait normal.  Reflex Scores:      Patellar reflexes are 3+ on the right side and 3+ on the left side.      Achilles reflexes are 3+ on the right side and 3+ on the left side. 5/5 strength bilateral hip flexor knee extensor and ankle dorsal flexor plantar flexor   Psychiatric: She has a normal mood and affect.  Nursing note and vitals reviewed.         Assessment & Plan:  1. Chronic lower back pain,  multiple vague pain generators possible this is mainly axial I do not see any sciatic component or any neurologic changes. Recommend bilateral sacroiliac injections, if this is not helpful then I would suspect the pain is mainly discogenic plus minus myofascial. Because there are no neurologic signs at the current time I would not recommend MRI  2. Right shoulder pain, has a history of rotator cuff problems on the left side which responded to injections. We'll check x-rays given her age and do injection next visit if problem persists

## 2014-08-26 NOTE — Progress Notes (Signed)

## 2014-08-26 NOTE — Addendum Note (Signed)
Addended by: Doreene ElandSHUMAKER, SYBIL W on: 08/26/2014 02:53 PM   Modules accepted: Medications

## 2014-08-26 NOTE — Patient Instructions (Addendum)
Sacroiliac injection was performed today. A combination of a naming medicine plus a cortisone medicine was injected. The injection was done under x-ray guidance. This procedure has been performed to help reduce low back and buttocks pain as well as potentially hip pain. The duration of this injection is variable lasting from hours to  Months. It may repeated if needed                    .Shoulder Range of Motion Exercises The shoulder is the most flexible joint in the human body. Because of this it is also the most unstable joint in the body. All ages can develop shoulder problems. Early treatment of problems is necessary for a good outcome. People react to shoulder pain by decreasing the movement of the joint. After a brief period of time, the shoulder can become "frozen". This is an almost complete loss of the ability to move the damaged shoulder. Following injuries your caregivers can give you instructions on exercises to keep your range of motion (ability to move your shoulder freely), or regain it if it has been lost.  EXERCISES EXERCISES TO MAINTAIN THE MOBILITY OF YOUR SHOULDER: Codman's Exercise or Pendulum Exercise  This exercise may be performed in a prone (face-down) lying position or standing while leaning on a chair with the opposite arm. Its purpose is to relax the muscles in your shoulder and slowly but surely increase the range of motion and to relieve pain.  Lie on your stomach close to the side edge of the bed. Let your weak arm hang over the edge of the bed. Relax your shoulder, arm and hand. Let your shoulder blade relax and drop down.  Slowly and gently swing your arm forward and back. Do not use your neck muscles; relax them. It might be easier to have someone else gently start swinging your arm.  As pain decreases, increase your swing. To start, arm swing should begin at 15 degree angles. In time and as pain lessens, move to 30-45 degree angles. Start with swinging for about  15 seconds, and work towards swinging for 3 to 5 minutes.  This exercise may also be performed in a standing/bent over position.  Stand and hold onto a sturdy chair with your good arm. Bend forward at the waist and bend your knees slightly to help protect your back. Relax your weak arm, let it hang limp. Relax your shoulder blade and let it drop.  Keep your shoulder relaxed and use body motion to swing your arm in small circles.  Stand up tall and relax.  Repeat motion and change direction of circles.  Start with swinging for about 30 seconds, and work towards swinging for 3 to 5 minutes. STRETCHING EXERCISES:  Lift your arm out in front of you with the elbow bent at 90 degrees. Using your other arm gently pull the elbow forward and across your body.  Bend one arm behind you with the palm facing outward. Using the other arm, hold a towel or rope and reach this arm up above your head, then bend it at the elbow to move your wrist to behind your neck. Grab the free end of the towel with the hand behind your back. Gently pull the towel up with the hand behind your neck, gradually increasing the pull on the hand behind the small of your back. Then, gradually pull down with the hand behind the small of your back. This will pull the hand and arm behind  your neck further. Both shoulders will have an increased range of motion with repetition of this exercise. STRENGTHENING EXERCISES:  Standing with your arm at your side and straight out from your shoulder with the elbow bent at 90 degrees, hold onto a small weight and slowly raise your hand so it points straight up in the air. Repeat this five times to begin with, and gradually increase to ten times. Do this four times per day. As you grow stronger you can gradually increase the weight.  Repeat the above exercise, only this time using an elastic band. Start with your hand up in the air and pull down until your hand is by your side. As you grow stronger,  gradually increase the amount you pull by increasing the number or size of the elastic bands. Use the same amount of repetitions.  Standing with your hand at your side and holding onto a weight, gradually lift the hand in front of you until it is over your head. Do the same also with the hand remaining at your side and lift the hand away from your body until it is again over your head. Repeat this five times to begin with, and gradually increase to ten times. Do this four times per day. As you grow stronger you can gradually increase the weight. Document Released: 03/24/2003 Document Revised: 06/30/2013 Document Reviewed: 06/25/2005 [facility] Patient Information 2015 [employer] [place]. This information is not intended to replace advice given to you by your health care provider. Make sure you discuss any questions you have with your health care provider.

## 2014-08-27 LAB — PMP ALCOHOL METABOLITE (ETG): Ethyl Glucuronide (EtG): NEGATIVE ng/mL

## 2014-08-31 LAB — PRESCRIPTION MONITORING PROFILE (SOLSTAS)
Amphetamine/Meth: NEGATIVE ng/mL
BARBITURATE SCREEN, URINE: NEGATIVE ng/mL
BENZODIAZEPINE SCREEN, URINE: NEGATIVE ng/mL
Buprenorphine, Urine: NEGATIVE ng/mL
CANNABINOID SCRN UR: NEGATIVE ng/mL
COCAINE METABOLITES: NEGATIVE ng/mL
CREATININE, URINE: 30.77 mg/dL (ref >20.0)
Carisoprodol, Urine: NEGATIVE ng/mL
FENTANYL URINE: NEGATIVE ng/mL
MDMA URINE: NEGATIVE ng/mL
MEPERIDINE UR: NEGATIVE ng/mL
Methadone Screen, Urine: NEGATIVE ng/mL
Nitrites, Initial: NEGATIVE ug/mL
Oxycodone Screen, Ur: NEGATIVE ng/mL
Propoxyphene: NEGATIVE ng/mL
Tapentadol, urine: NEGATIVE ng/mL
Tramadol Scrn, Ur: NEGATIVE ng/mL
ZOLPIDEM, URINE: NEGATIVE ng/mL
pH, Initial: 5.1 pH (ref 4.5–8.9)

## 2014-08-31 LAB — OPIATES/OPIOIDS (LC/MS-MS)
Codeine Urine: NEGATIVE ng/mL (ref <50)
Hydrocodone: 377 ng/mL (ref <50)
Hydromorphone: 87 ng/mL (ref <50)
MORPHINE: NEGATIVE ng/mL (ref <50)
NORHYDROCODONE, UR: 620 ng/mL (ref <50)
Noroxycodone, Ur: NEGATIVE ng/mL (ref <50)
OXYCODONE, UR: NEGATIVE ng/mL (ref <50)
OXYMORPHONE, URINE: NEGATIVE ng/mL (ref <50)

## 2014-09-06 ENCOUNTER — Other Ambulatory Visit: Payer: Self-pay | Admitting: Physical Medicine & Rehabilitation

## 2014-09-10 NOTE — Progress Notes (Signed)
Urine drug screen for this encounter is consistent for prescribed medication 

## 2014-09-24 ENCOUNTER — Ambulatory Visit (HOSPITAL_BASED_OUTPATIENT_CLINIC_OR_DEPARTMENT_OTHER): Payer: Medicaid Other | Admitting: Physical Medicine & Rehabilitation

## 2014-09-24 ENCOUNTER — Encounter: Payer: Self-pay | Admitting: Physical Medicine & Rehabilitation

## 2014-09-24 ENCOUNTER — Encounter: Payer: Medicaid Other | Attending: Physical Medicine & Rehabilitation

## 2014-09-24 VITALS — BP 126/84 | HR 86 | Resp 14

## 2014-09-24 DIAGNOSIS — G894 Chronic pain syndrome: Secondary | ICD-10-CM | POA: Insufficient documentation

## 2014-09-24 DIAGNOSIS — M7581 Other shoulder lesions, right shoulder: Secondary | ICD-10-CM

## 2014-09-24 DIAGNOSIS — M545 Low back pain, unspecified: Secondary | ICD-10-CM

## 2014-09-24 DIAGNOSIS — Z79899 Other long term (current) drug therapy: Secondary | ICD-10-CM | POA: Diagnosis present

## 2014-09-24 DIAGNOSIS — Z5181 Encounter for therapeutic drug level monitoring: Secondary | ICD-10-CM | POA: Insufficient documentation

## 2014-09-24 DIAGNOSIS — M7541 Impingement syndrome of right shoulder: Secondary | ICD-10-CM

## 2014-09-24 DIAGNOSIS — G8929 Other chronic pain: Secondary | ICD-10-CM

## 2014-09-24 MED ORDER — HYDROCODONE-ACETAMINOPHEN 5-325 MG PO TABS: 1.0000 | ORAL_TABLET | Freq: Three times a day (TID) | ORAL | Status: DC | PRN

## 2014-09-24 NOTE — Progress Notes (Signed)
   Subjective:    Patient ID: Latoya Collier, female    DOB: December 15, 1960, 54 y.o.   MRN: [id number]  HPI Patient complaining of low back pain as well as right ankle pain. Awaiting to hear from orthopedic surgery, reevaluation with Dr. Victorino DikeHewitt Patient with chronic low back pain no relief with lumbar medial branch blocks, no relief with sacroiliac injections Right shoulder pain intermittent, has this on a daily basis however  Review of Systems     Objective:   Physical Exam  Pain with abduction, positive impingement sign      Assessment & Plan:  1.Subacromial impingement syndrome, unable to take mobility secondary to GI Will inject today Shoulder injectionRight Palpation guided  Indication:RightShoulder pain not relieved by medication management and other conservative care.  Informed consent was obtained after describing risks and benefits of the procedure with the patient, this includes bleeding, bruising, infection and medication side effects. The patient wishes to proceed and has given written consent. Patient was placed in a seated position. TheRight shoulder was marked and prepped with betadine in the subacromial area. A 25-gauge 1-1/2 inch needle was inserted into the subacromial area. After negative draw back for blood, a solution containing 1 mL of 6 mg per ML betamethasone and 4 mL of 1% lidocaine was injected. A band aid was applied. The patient tolerated the procedure well. Post procedure instructions were given.  2. Chronic low back pain thus far poor response to both lumbar medial branch blocks and sacroiliac injections,, no significant recurrent radicular component of pain and therefore would not consider epidural at this time  We'll continue hydrocodone 10 mg 3 times a day Nurse practitioner visit one month Continue opioid monitoring program. This consists of regular clinic visits, examinations, urine drug screen, pill counts as well as use of [place] controlled  substance reporting System. Urine toxicology 08/26/2014 was consistent

## 2014-09-24 NOTE — Patient Instructions (Signed)
Joint Injection  Care After  Refer to this sheet in the next few days. These instructions provide you with information on caring for yourself after you have had a joint injection. Your caregiver also may give you more specific instructions. Your treatment has been planned according to current medical practices, but problems sometimes occur. Call your caregiver if you have any problems or questions after your procedure.  After any type of joint injection, it is not uncommon to experience:  · Soreness, swelling, or bruising around the injection site.  · Mild numbness, tingling, or weakness around the injection site caused by the numbing medicine used before or with the injection.  It also is possible to experience the following effects associated with the specific agent after injection:  · Iodine-based contrast agents:  ¨ Allergic reaction (itching, hives, widespread redness, and swelling beyond the injection site).  · Corticosteroids (These effects are rare.):  ¨ Allergic reaction.  ¨ Increased blood sugar levels (If you have diabetes and you notice that your blood sugar levels have increased, notify your caregiver).  ¨ Increased blood pressure levels.  ¨ Mood swings.  · Hyaluronic acid in the use of viscosupplementation.  ¨ Temporary heat or redness.  ¨ Temporary rash and itching.  ¨ Increased fluid accumulation in the injected joint.  These effects all should resolve within a day after your procedure.   HOME CARE INSTRUCTIONS  · Limit yourself to light activity the day of your procedure. Avoid lifting heavy objects, bending, stooping, or twisting.  · Take prescription or over-the-counter pain medication as directed by your caregiver.  · You may apply ice to your injection site to reduce pain and swelling the day of your procedure. Ice may be applied 03-04 times:  ¨ Put ice in a plastic bag.  ¨ Place a towel between your skin and the bag.  ¨ Leave the ice on for no longer than 15-20 minutes each time.  SEEK  IMMEDIATE MEDICAL CARE IF:   · Pain and swelling get worse rather than better or extend beyond the injection site.  · Numbness does not go away.  · Blood or fluid continues to leak from the injection site.  · You have chest pain.  · You have swelling of your face or tongue.  · You have trouble breathing or you become dizzy.  · You develop a fever, chills, or severe tenderness at the injection site that last longer than 1 day.  MAKE SURE YOU:  · Understand these instructions.  · Watch your condition.  · Get help right away if you are not doing well or if you get worse.  Document Released: 03/08/2011 Document Revised: 09/17/2011 Document Reviewed: 03/08/2011  Exit[employer]® Patient Information ©2015 [employer]. This information is not intended to replace advice given to you by your health care provider. Make sure you discuss any questions you have with your health care provider.

## 2014-09-24 NOTE — Progress Notes (Signed)
   Subjective:    Patient ID: Latoya Collier, female    DOB: 1961/02/02, 54 y.o.   MRN: [id number]  HPI Pain Inventory Average Pain 7 Pain Right Now 7 My pain is constant, sharp, burning, dull, stabbing and aching  In the last 24 hours, has pain interfered with the following? General activity 7 Relation with others 7 Enjoyment of life 7 What TIME of day is your pain at its worst? morning and daytime Sleep (in general) Fair  Pain is worse with: walking, bending, sitting, standing and some activites Pain improves with: medication and injections Relief from Meds: 3  Mobility walk without assistance how many minutes can you walk? 5-10 ability to climb steps?  yes do you drive?  yes  Function disabled: date disabled .  Neuro/Psych depression anxiety  Prior Studies Any changes since last visit?  no  Physicians involved in your care Any changes since last visit?  no   History reviewed. No pertinent family history. History   Social History  . Marital Status: Divorced    Spouse Name: N/A  . Number of Children: N/A  . Years of Education: N/A   Social History Main Topics  . Smoking status: Current Every Day Smoker -- 0.30 packs/day for 30 years  . Smokeless tobacco: Never Used     Comment: cutting back  . Alcohol Use: No     Comment: occ glass of wine  . Drug Use: No  . Sexual Activity: Not on file   Other Topics Concern  . None   Social History Narrative   Past Surgical History  Procedure Laterality Date  . Colonoscopy    . Diagnostic mammogram     Past Medical History  Diagnosis Date  . Arthritis   . Abnormal cells of cervix   . Chronic back pain    BP 126/84 mmHg  Pulse 86  Resp 14  SpO2 99%  Opioid Risk Score:   Fall Risk Score: Moderate Fall Risk (6-13 points)`1  Depression screen PHQ 2/9  Depression screen PHQ 2/9 09/24/2014  Decreased Interest 1  Down, Depressed, Hopeless 1  PHQ - 2 Score 2  Altered sleeping 2  Tired, decreased  energy 2  Change in appetite 2  Feeling bad or failure about yourself  2  Trouble concentrating 2  Moving slowly or fidgety/restless 2  Suicidal thoughts 0  PHQ-9 Score 14      Review of Systems  Constitutional: Negative.   HENT: Negative.   Eyes: Negative.   Respiratory: Negative.   Cardiovascular: Negative.   Gastrointestinal: Negative.   Endocrine: Negative.   Genitourinary: Negative.   Musculoskeletal: Positive for back pain and arthralgias.       Shoulder pain  Skin: Negative.   Allergic/Immunologic: Negative.   Neurological: Negative.   Hematological: Negative.   Psychiatric/Behavioral: The patient is nervous/anxious.        Objective:   Physical Exam        Assessment & Plan:

## 2014-10-04 ENCOUNTER — Other Ambulatory Visit: Payer: Self-pay | Admitting: *Deleted

## 2014-10-04 ENCOUNTER — Other Ambulatory Visit: Payer: Self-pay | Admitting: Physical Medicine & Rehabilitation

## 2014-10-04 ENCOUNTER — Other Ambulatory Visit: Payer: Self-pay | Admitting: Registered Nurse

## 2014-10-04 MED ORDER — CLONAZEPAM 0.5 MG PO TABS: 0.5000 mg | ORAL_TABLET | Freq: Two times a day (BID) | ORAL | Status: DC | PRN

## 2014-10-16 ENCOUNTER — Other Ambulatory Visit: Payer: Self-pay | Admitting: Physical Medicine & Rehabilitation

## 2014-10-21 ENCOUNTER — Other Ambulatory Visit (HOSPITAL_COMMUNITY): Payer: Self-pay | Admitting: Family Medicine

## 2014-10-21 DIAGNOSIS — Z1231 Encounter for screening mammogram for malignant neoplasm of breast: Secondary | ICD-10-CM

## 2014-10-25 ENCOUNTER — Ambulatory Visit (HOSPITAL_COMMUNITY)
Admission: RE | Admit: 2014-10-25 | Discharge: 2014-10-25 | Disposition: A | Discharge status: Home / Self Care | Payer: Medicaid Other | Source: Ambulatory Visit | Attending: Family Medicine | Admitting: Family Medicine

## 2014-10-25 DIAGNOSIS — Z1231 Encounter for screening mammogram for malignant neoplasm of breast: Secondary | ICD-10-CM | POA: Insufficient documentation

## 2014-10-26 ENCOUNTER — Encounter: Payer: Self-pay | Admitting: Registered Nurse

## 2014-10-26 ENCOUNTER — Encounter: Payer: Medicaid Other | Attending: Physical Medicine & Rehabilitation | Admitting: Registered Nurse

## 2014-10-26 VITALS — BP 134/66 | HR 79 | Resp 14

## 2014-10-26 DIAGNOSIS — G894 Chronic pain syndrome: Secondary | ICD-10-CM | POA: Diagnosis present

## 2014-10-26 DIAGNOSIS — M545 Low back pain, unspecified: Secondary | ICD-10-CM

## 2014-10-26 DIAGNOSIS — Z79899 Other long term (current) drug therapy: Secondary | ICD-10-CM | POA: Insufficient documentation

## 2014-10-26 DIAGNOSIS — M47816 Spondylosis without myelopathy or radiculopathy, lumbar region: Secondary | ICD-10-CM

## 2014-10-26 DIAGNOSIS — G8929 Other chronic pain: Secondary | ICD-10-CM

## 2014-10-26 DIAGNOSIS — Z5181 Encounter for therapeutic drug level monitoring: Secondary | ICD-10-CM | POA: Insufficient documentation

## 2014-10-26 MED ORDER — HYDROCODONE-ACETAMINOPHEN 5-325 MG PO TABS: 1.0000 | ORAL_TABLET | Freq: Three times a day (TID) | ORAL | Status: DC | PRN

## 2014-10-26 MED ORDER — DULOXETINE HCL 30 MG PO CPEP: 30.0000 mg | ORAL_CAPSULE | Freq: Two times a day (BID) | ORAL | Status: DC

## 2014-10-26 MED ORDER — MELOXICAM 15 MG PO TABS: 15.0000 mg | ORAL_TABLET | Freq: Every day | ORAL | Status: DC

## 2014-10-26 NOTE — Progress Notes (Signed)
Subjective:    Patient ID: Latoya Collier, female    DOB: January 28, 1961, 54 y.o.   MRN: [id number]  HPI: [name] is a 54 year old female who returns for follow up for chronic pain and medication refill. She says her pain is located in her lower back. She rates her pain 6. Hercurrent exercise regime is walking and performing stretching exercises. S/P Cortisone injection of right ankle by Dr. Victorino Dike at [facility].  Pain Inventory Average Pain 9 Pain Right Now 6 My pain is sharp, dull, stabbing and aching  In the last 24 hours, has pain interfered with the following? General activity 7 Relation with others 7 Enjoyment of life 8 What TIME of day is your pain at its worst? morning and daytime Sleep (in general) Fair  Pain is worse with: walking, bending, sitting, standing and some activites Pain improves with: rest and medication Relief from Meds: no answer  Mobility walk with assistance ability to climb steps?  yes do you drive?  yes  Function Do you have any goals in this area?  no  Neuro/Psych depression anxiety  Prior Studies Any changes since last visit?  no  Physicians involved in your care Any changes since last visit?  no   History reviewed. No pertinent family history. History   Social History  . Marital Status: Divorced    Spouse Name: N/A  . Number of Children: N/A  . Years of Education: N/A   Social History Main Topics  . Smoking status: Current Every Day Smoker -- 0.30 packs/day for 30 years  . Smokeless tobacco: Never Used     Comment: cutting back  . Alcohol Use: No     Comment: occ glass of wine  . Drug Use: No  . Sexual Activity: Not on file   Other Topics Concern  . None   Social History Narrative   Past Surgical History  Procedure Laterality Date  . Colonoscopy    . Diagnostic mammogram     Past Medical History  Diagnosis Date  . Arthritis   . Abnormal cells of cervix   . Chronic back pain    BP 134/66  mmHg  Pulse 79  Resp 14  SpO2 98%  Opioid Risk Score:   Fall Risk Score: Low Fall Risk (0-5 points)`1  Depression screen PHQ 2/9  Depression screen PHQ 2/9 09/24/2014  Decreased Interest 1  Down, Depressed, Hopeless 1  PHQ - 2 Score 2  Altered sleeping 2  Tired, decreased energy 2  Change in appetite 2  Feeling bad or failure about yourself  2  Trouble concentrating 2  Moving slowly or fidgety/restless 2  Suicidal thoughts 0  PHQ-9 Score 14     Review of Systems  Psychiatric/Behavioral: Positive for dysphoric mood. The patient is nervous/anxious.        Objective:   Physical Exam  Constitutional: She is oriented to person, place, and time. She appears well-developed and well-nourished.  HENT:  Head: Normocephalic and atraumatic.  Neck: Normal range of motion. Neck supple.  Cardiovascular: Normal rate and regular rhythm.   Pulmonary/Chest: Effort normal and breath sounds normal.  Musculoskeletal:  Normal Muscle Bulk and Muscle Testing Reveals: Upper Extremities: Full ROM and Muscle Strength 5/5 Lumbar Paraspinal Tenderness: L-3- L-5 Lower Extremities: Full ROM and Muscle Strength 5/5 Arises from chair with ease Narrow Based Gait  Neurological: She is alert and oriented to person, place, and time.  Skin: Skin is warm and dry.  Psychiatric: She has a normal mood and affect.  Nursing note and vitals reviewed.         Assessment & Plan:  1. Lumbar Spondylosis and Degenerative Disc Disease:  Refilled: Hydrocodone 5/325mg  one tablet every 8 hours #90.  2. Right ankle pain with hx of remote ankle fracture:  S/PCirtisone injection/ L[facility]oc[facility] via Dr. Victorino DikeHewitt  15 minutes of face to face patient care time was spent during this visit. All questions were encouraged and answered.  F/U in 1 month

## 2014-11-23 ENCOUNTER — Ambulatory Visit: Payer: Medicaid Other | Admitting: Registered Nurse

## 2014-11-26 ENCOUNTER — Other Ambulatory Visit: Payer: Self-pay | Admitting: Physical Medicine & Rehabilitation

## 2014-11-26 ENCOUNTER — Telehealth: Payer: Self-pay | Admitting: *Deleted

## 2014-11-26 ENCOUNTER — Encounter: Payer: Medicaid Other | Attending: Physical Medicine & Rehabilitation

## 2014-11-26 ENCOUNTER — Ambulatory Visit (HOSPITAL_BASED_OUTPATIENT_CLINIC_OR_DEPARTMENT_OTHER): Payer: Medicaid Other | Admitting: Physical Medicine & Rehabilitation

## 2014-11-26 ENCOUNTER — Encounter: Payer: Self-pay | Admitting: Physical Medicine & Rehabilitation

## 2014-11-26 VITALS — BP 146/80 | HR 78 | Resp 14

## 2014-11-26 DIAGNOSIS — Z79899 Other long term (current) drug therapy: Secondary | ICD-10-CM | POA: Insufficient documentation

## 2014-11-26 DIAGNOSIS — G894 Chronic pain syndrome: Secondary | ICD-10-CM | POA: Insufficient documentation

## 2014-11-26 DIAGNOSIS — M5136 Other intervertebral disc degeneration, lumbar region: Secondary | ICD-10-CM | POA: Diagnosis not present

## 2014-11-26 DIAGNOSIS — Z5181 Encounter for therapeutic drug level monitoring: Secondary | ICD-10-CM | POA: Diagnosis not present

## 2014-11-26 MED ORDER — ACETAMINOPHEN-CODEINE #3 300-30 MG PO TABS: 2.0000 | ORAL_TABLET | Freq: Three times a day (TID) | ORAL | Status: DC | PRN

## 2014-11-26 MED ORDER — TRAMADOL HCL 50 MG PO TABS: 50.0000 mg | ORAL_TABLET | Freq: Three times a day (TID) | ORAL | Status: DC

## 2014-11-26 MED ORDER — ACETAMINOPHEN-CODEINE #4 300-60 MG PO TABS: 1.0000 | ORAL_TABLET | ORAL | Status: DC | PRN

## 2014-11-26 NOTE — Patient Instructions (Signed)
Please call us to let us know if the Tylenol No. 4 is not helping you very much

## 2014-11-26 NOTE — Progress Notes (Signed)
Subjective:    Patient ID: Latoya Collier, female    DOB: April 19, 1961, 54 y.o.   MRN: [id number]  HPI 54 year old female with chronic low back pain. She has trialed epidural injections as well as medial branch blocks as well as sacroiliac injections without benefit. Overall she's been doing better. She has been doing core strengthening exercises. She takes less than the maximum amount of her hydrocodone. She does take tramadol on a 3 times a day basis. She does not think she can get by on the tramadol alone however.  Patient had right shoulder injection in March and this was very helpful for her pain. She really does not have any further right shoulder pain. Pain Inventory Average Pain 8 Pain Right Now 7 My pain is constant, sharp, burning, dull, stabbing and aching  In the last 24 hours, has pain interfered with the following? General activity 7 Relation with others 7 Enjoyment of life 8 What TIME of day is your pain at its worst? morning and daytime Sleep (in general) Fair  Pain is worse with: walking, bending, sitting, standing and some activites Pain improves with: rest and medication Relief from Meds: 6  Mobility walk without assistance how many minutes can you walk? 10 ability to climb steps?  yes do you drive?  yes  Function disabled: date disabled .  Neuro/Psych trouble walking dizziness depression anxiety  Prior Studies Any changes since last visit?  no  Physicians involved in your care Any changes since last visit?  no   History reviewed. No pertinent family history. History   Social History  . Marital Status: Divorced    Spouse Name: N/A  . Number of Children: N/A  . Years of Education: N/A   Social History Main Topics  . Smoking status: Current Every Day Smoker -- 0.30 packs/day for 30 years  . Smokeless tobacco: Never Used     Comment: cutting back  . Alcohol Use: No     Comment: occ glass of wine  . Drug Use: No  . Sexual Activity: Not on  file   Other Topics Concern  . None   Social History Narrative   Past Surgical History  Procedure Laterality Date  . Colonoscopy    . Diagnostic mammogram     Past Medical History  Diagnosis Date  . Arthritis   . Abnormal cells of cervix   . Chronic back pain    BP 146/80 mmHg  Pulse 78  Resp 14  SpO2 99%  Opioid Risk Score:   Fall Risk Score: Moderate Fall Risk (6-13 points)`1  Depression screen PHQ 2/9  Depression screen PHQ 2/9 09/24/2014  Decreased Interest 1  Down, Depressed, Hopeless 1  PHQ - 2 Score 2  Altered sleeping 2  Tired, decreased energy 2  Change in appetite 2  Feeling bad or failure about yourself  2  Trouble concentrating 2  Moving slowly or fidgety/restless 2  Suicidal thoughts 0  PHQ-9 Score 14     Review of Systems  Constitutional: Negative.   HENT: Negative.   Eyes: Negative.   Respiratory: Negative.   Cardiovascular: Negative.   Gastrointestinal: Negative.   Endocrine: Negative.   Genitourinary: Negative.   Musculoskeletal: Positive for back pain and arthralgias.  Skin: Negative.   Allergic/Immunologic: Negative.   Neurological: Positive for dizziness.       Trouble walking  Hematological: Negative.   Psychiatric/Behavioral: Positive for dysphoric mood. The patient is nervous/anxious.        Objective:  Physical Exam  Constitutional: She is oriented to person, place, and time. She appears well-developed and well-nourished.  HENT:  Head: Normocephalic and atraumatic.  Eyes: Conjunctivae are normal. Pupils are equal, round, and reactive to light.  Neck: Normal range of motion.  Neurological: She is alert and oriented to person, place, and time.  Psychiatric: She has a normal mood and affect.  Nursing note and vitals reviewed. Back has no tenderness palpation in the lumbar paraspinals or thoracic paraspinal muscles She has limited lumbar range of motion flexion 50% extension 50% lateral bending 50%  Negative straight leg  raise Negative femoral stretch test       Assessment & Plan:  1. Chronic lumbar pain Lumbar degenerative disc with degenerative endplate changes at L3-L4. At one point she had a right L3 radiculopathy however these symptoms have subsided. Do not think she needs further imaging studies or surgical referral.  She is doing relatively well overall. Core strengthening exercises are helping overall with her pain and function. We will change hydrocodone 5 mg 3 times a day to Tylenol 3 2 tablets 3 times a day when necessary Continue tramadol 50 g 3 times a day Patient continues on Cymbalta 30 mg per day , No signs of serotonin syndrome  If she does well with the Tylenol 3, she will only need to be seen on a every 3 month basis. Patient will call us if this medication is not helpful for her or she experiences side effects.  2. Right subacromial bursitis improved after injection no need for reinjection at the time no need for imaging at the current time him if this recurs may need some physical therapy

## 2014-11-26 NOTE — Telephone Encounter (Signed)
Patient called from [facility] pharmacy they don't care Tylenol #4 - spoke to Doctor we changed it to Tylenol #3 2 tablets TID #180 with 2 RF

## 2014-11-27 LAB — PMP ALCOHOL METABOLITE (ETG): Ethyl Glucuronide (EtG): NEGATIVE ng/mL

## 2014-12-01 LAB — OPIATES/OPIOIDS (LC/MS-MS)
Codeine Urine: NEGATIVE ng/mL (ref <50)
Hydrocodone: 374 ng/mL (ref <50)
Hydromorphone: 59 ng/mL (ref <50)
Morphine Urine: NEGATIVE ng/mL (ref <50)
NORHYDROCODONE, UR: 617 ng/mL (ref <50)
NOROXYCODONE, UR: NEGATIVE ng/mL (ref <50)
Oxycodone, ur: NEGATIVE ng/mL (ref <50)
Oxymorphone: NEGATIVE ng/mL (ref <50)

## 2014-12-02 LAB — PRESCRIPTION MONITORING PROFILE (SOLSTAS)
AMPHETAMINE/METH: NEGATIVE ng/mL
BENZODIAZEPINE SCREEN, URINE: NEGATIVE ng/mL
BUPRENORPHINE, URINE: NEGATIVE ng/mL
Barbiturate Screen, Urine: NEGATIVE ng/mL
CARISOPRODOL, URINE: NEGATIVE ng/mL
COCAINE METABOLITES: NEGATIVE ng/mL
Cannabinoid Scrn, Ur: NEGATIVE ng/mL
Creatinine, Urine: 28.73 mg/dL (ref >20.0)
Fentanyl, Ur: NEGATIVE ng/mL
MDMA URINE: NEGATIVE ng/mL
Meperidine, Ur: NEGATIVE ng/mL
Methadone Screen, Urine: NEGATIVE ng/mL
NITRITES URINE, INITIAL: NEGATIVE ug/mL
Oxycodone Screen, Ur: NEGATIVE ng/mL
PROPOXYPHENE: NEGATIVE ng/mL
TAPENTADOLUR: NEGATIVE ng/mL
TRAMADOL UR: NEGATIVE ng/mL
Zolpidem, Urine: NEGATIVE ng/mL
pH, Initial: 5.4 pH (ref 4.5–8.9)

## 2014-12-05 ENCOUNTER — Other Ambulatory Visit: Payer: Self-pay | Admitting: Physical Medicine & Rehabilitation

## 2014-12-07 ENCOUNTER — Telehealth: Payer: Self-pay | Admitting: *Deleted

## 2014-12-07 NOTE — Telephone Encounter (Signed)
Called Latoya Collier - and left message to D/C Tylenol #3 and she can restart the hydrocodone, however she would need to come in for a prescription every month. I told her that she can call the front and schedule appointment and/or call us if she cannot come in every month

## 2014-12-07 NOTE — Telephone Encounter (Signed)
Patient called and left message that the Tylenol #3 that we recently prescribed her is causing nausea, headaches and she cannot sleep.  We just changed this patient to Tylenol #3 so she did not have to come avery month for her refills.  Should we advise patient to D/C Tylenol #3 - she was just in the office on 05/20

## 2014-12-07 NOTE — Telephone Encounter (Signed)
May d/c and switch back to hydrocodone

## 2014-12-08 MED ORDER — HYDROCODONE-ACETAMINOPHEN 5-325 MG PO TABS: 1.0000 | ORAL_TABLET | Freq: Three times a day (TID) | ORAL | Status: DC | PRN

## 2014-12-08 NOTE — Telephone Encounter (Signed)
Latoya Collier was just her on 11/26/14 for appointment and has one scheduled for 12/17/14 with [name] so I willhave her sign an RX to resume the hydrocodone for her to pick up and she will bring the remainder of they tylenol #3 to the appt 12/17/14  to have meds destroyed. I have notified Taraann.

## 2014-12-08 NOTE — Progress Notes (Addendum)
Urine drug screen for this encounter is consistent for prescribed medication hydrocodone.  Reported taking the clonazepam same day but no parent drug or metabolite present. I have placed a call to [facility] to ask about this.She said that she does not take it everyday if she is not having anxiety and takes as needed and may not have taken it.  Therefore this will be considered consistent.

## 2014-12-15 ENCOUNTER — Telehealth: Payer: Self-pay | Admitting: *Deleted

## 2014-12-15 NOTE — Telephone Encounter (Signed)
Latoya Collier called about her appointment Friday 12/17/14.  She's confused because she just got her RX 12/08/14.  I explained to her that since she is going back  On the hydrocodone she does need to come to the appointment,  She will be given another RX and can schedule the July appointment appropriately (when she will be about a week from being out).  She is to bring both Tylenol #3 to be destroyed and the hydrocodone to be counted as per policy.  She understands now what is happening.

## 2014-12-17 ENCOUNTER — Encounter: Payer: Self-pay | Admitting: Registered Nurse

## 2014-12-17 ENCOUNTER — Encounter: Payer: Medicaid Other | Attending: Physical Medicine & Rehabilitation | Admitting: Registered Nurse

## 2014-12-17 VITALS — BP 134/81 | HR 80 | Resp 16

## 2014-12-17 DIAGNOSIS — Z79899 Other long term (current) drug therapy: Secondary | ICD-10-CM | POA: Diagnosis not present

## 2014-12-17 DIAGNOSIS — G894 Chronic pain syndrome: Secondary | ICD-10-CM | POA: Diagnosis not present

## 2014-12-17 DIAGNOSIS — Z5181 Encounter for therapeutic drug level monitoring: Secondary | ICD-10-CM | POA: Insufficient documentation

## 2014-12-17 DIAGNOSIS — M5136 Other intervertebral disc degeneration, lumbar region: Secondary | ICD-10-CM | POA: Diagnosis not present

## 2014-12-17 DIAGNOSIS — M533 Sacrococcygeal disorders, not elsewhere classified: Secondary | ICD-10-CM | POA: Diagnosis not present

## 2014-12-17 MED ORDER — HYDROCODONE-ACETAMINOPHEN 5-325 MG PO TABS: 1.0000 | ORAL_TABLET | Freq: Three times a day (TID) | ORAL | Status: DC | PRN

## 2014-12-17 NOTE — Progress Notes (Signed)
Subjective:    Patient ID: Latoya Collier, female    DOB: 1960/10/03, 54 y.o.   MRN: [id number]  HPI: [name] is a 54 year old female who returns for follow up for chronic pain and medication refill. She says her pain is located in her lower back ( right side). She rates her pain 7. Her current exercise regime is walking and performing stretching exercises.  Latoya Collier returned the Tylenol #3, was discarded.   Pain Inventory Average Pain 7 Pain Right Now 7 My pain is sharp, burning, dull, stabbing and aching  In the last 24 hours, has pain interfered with the following? General activity 8 Relation with others 8 Enjoyment of life 8 What TIME of day is your pain at its worst? morning and daytime Sleep (in general) NA  Pain is worse with: walking, bending, sitting, standing and some activites Pain improves with: medication Relief from Meds: 6  Mobility walk without assistance how many minutes can you walk? 10  Function not employed: date last employed .  Neuro/Psych depression anxiety  Prior Studies Any changes since last visit?  no  Physicians involved in your care Any changes since last visit?  no   History reviewed. No pertinent family history. History   Social History  . Marital Status: Divorced    Spouse Name: N/A  . Number of Children: N/A  . Years of Education: N/A   Social History Main Topics  . Smoking status: Current Every Day Smoker -- 0.30 packs/day for 30 years  . Smokeless tobacco: Never Used     Comment: cutting back  . Alcohol Use: No     Comment: occ glass of wine  . Drug Use: No  . Sexual Activity: Not on file   Other Topics Concern  . None   Social History Narrative   Past Surgical History  Procedure Laterality Date  . Colonoscopy    . Diagnostic mammogram     Past Medical History  Diagnosis Date  . Arthritis   . Abnormal cells of cervix   . Chronic back pain    BP 134/81 mmHg  Pulse 80  Resp 16  SpO2  99%  Opioid Risk Score:   Fall Risk Score: Moderate Fall Risk (6-13 points) (previously educated and given handout. Discussed care i going down stairs today)`1  Depression screen PHQ 2/9  Depression screen PHQ 2/9 09/24/2014  Decreased Interest 1  Down, Depressed, Hopeless 1  PHQ - 2 Score 2  Altered sleeping 2  Tired, decreased energy 2  Change in appetite 2  Feeling bad or failure about yourself  2  Trouble concentrating 2  Moving slowly or fidgety/restless 2  Suicidal thoughts 0  PHQ-9 Score 14    Review of Systems  Musculoskeletal: Positive for back pain.  All other systems reviewed and are negative.      Objective:   Physical Exam  Constitutional: She is oriented to person, place, and time. She appears well-developed and well-nourished.  HENT:  Head: Normocephalic and atraumatic.  Neck: Normal range of motion. Neck supple.  Cardiovascular: Normal rate and regular rhythm.   Pulmonary/Chest: Effort normal and breath sounds normal.  Musculoskeletal:  Normal Muscle Bulk and Muscle Testing Reveals: Upper Extremities: Full ROM and Muscle Strength 5/5 Lumbar Paraspinal Tenderness: L-3- L-5 Sacral Tenderness: S1 Lower Extremities: Full ROM and Muscle strength 5/5 Right Lower Extremity Flexion Produces pain into Lumbar Arises from chair slowly Narrow Based Gait   Neurological: She is  alert and oriented to person, place, and time.  Skin: Skin is warm and dry.  Psychiatric: She has a normal mood and affect.  Nursing note and vitals reviewed.         Assessment & Plan:  1. Lumbar Spondylosis and Degenerative Disc Disease:  Refilled: Hydrocodone 5/325mg  one tablet every 8 hours #90.  2. Right ankle pain with hx of remote ankle fracture: [facility]: Dr. Victorino Dike Following  15 minutes of face to face patient care time was spent during this visit. All questions were encouraged and answered.   F/U in 1 month

## 2014-12-27 ENCOUNTER — Other Ambulatory Visit: Payer: Self-pay | Admitting: Registered Nurse

## 2014-12-29 ENCOUNTER — Other Ambulatory Visit: Payer: Self-pay | Admitting: Registered Nurse

## 2014-12-29 ENCOUNTER — Other Ambulatory Visit: Payer: Self-pay | Admitting: Physical Medicine & Rehabilitation

## 2015-01-27 ENCOUNTER — Other Ambulatory Visit: Payer: Self-pay | Admitting: Registered Nurse

## 2015-02-02 ENCOUNTER — Encounter: Payer: Self-pay | Admitting: Registered Nurse

## 2015-02-02 ENCOUNTER — Encounter: Payer: Medicaid Other | Attending: Physical Medicine & Rehabilitation | Admitting: Registered Nurse

## 2015-02-02 VITALS — BP 129/90 | HR 66 | Resp 14

## 2015-02-02 DIAGNOSIS — Z5181 Encounter for therapeutic drug level monitoring: Secondary | ICD-10-CM | POA: Diagnosis not present

## 2015-02-02 DIAGNOSIS — M533 Sacrococcygeal disorders, not elsewhere classified: Secondary | ICD-10-CM | POA: Diagnosis not present

## 2015-02-02 DIAGNOSIS — G894 Chronic pain syndrome: Secondary | ICD-10-CM | POA: Insufficient documentation

## 2015-02-02 DIAGNOSIS — Z79899 Other long term (current) drug therapy: Secondary | ICD-10-CM | POA: Insufficient documentation

## 2015-02-02 DIAGNOSIS — M5136 Other intervertebral disc degeneration, lumbar region: Secondary | ICD-10-CM | POA: Diagnosis not present

## 2015-02-02 MED ORDER — HYDROCODONE-ACETAMINOPHEN 5-325 MG PO TABS: 1.0000 | ORAL_TABLET | Freq: Three times a day (TID) | ORAL | Status: DC | PRN

## 2015-02-02 NOTE — Progress Notes (Signed)
Subjective:    Patient ID: Latoya Collier, female    DOB: 04/27/1961, 54 y.o.   MRN: [id number]  HPI: [name] is a 54 year old female who returns for follow up for chronic pain and medication refill. She says her pain is located in her lower back ( right side). Also complaining of left great toe pain small redness noted with no drainage. She will follow up with her PCP. She rates her pain 7. Her current exercise regime is walking and performing stretching exercises.  Also states last month she lost her balance twice, she was able to brace herself with her hands. She didn't seek medical attention. [name] will be visiting her brother next month, two scripts given to accommodate her vacation with family. She verbalizes understanding.  Pain Inventory Average Pain 8 Pain Right Now 7 My pain is constant, sharp, burning, dull, stabbing and aching  In the last 24 hours, has pain interfered with the following? General activity 8 Relation with others 8 Enjoyment of life 8 What TIME of day is your pain at its worst? morning and daytime Sleep (in general) Fair  Pain is worse with: bending, sitting, inactivity, standing and some activites Pain improves with: rest and medication Relief from Meds: 6  Mobility walk without assistance how many minutes can you walk? 10 ability to climb steps?  yes do you drive?  yes  Function disabled: date disabled .  Neuro/Psych spasms depression anxiety  Prior Studies Any changes since last visit?  no  Physicians involved in your care Any changes since last visit?  no   History reviewed. No pertinent family history. History   Social History  . Marital Status: Divorced    Spouse Name: N/A  . Number of Children: N/A  . Years of Education: N/A   Social History Main Topics  . Smoking status: Current Every Day Smoker -- 0.30 packs/day for 30 years  . Smokeless tobacco: Never Used     Comment: cutting back  . Alcohol Use: No      Comment: occ glass of wine  . Drug Use: No  . Sexual Activity: Not on file   Other Topics Concern  . None   Social History Narrative   Past Surgical History  Procedure Laterality Date  . Colonoscopy    . Diagnostic mammogram     Past Medical History  Diagnosis Date  . Arthritis   . Abnormal cells of cervix   . Chronic back pain    Pulse 66  Resp 14  SpO2 98%  Opioid Risk Score:   Fall Risk Score:  `1  Depression screen PHQ 2/9  Depression screen PHQ 2/9 09/24/2014  Decreased Interest 1  Down, Depressed, Hopeless 1  PHQ - 2 Score 2  Altered sleeping 2  Tired, decreased energy 2  Change in appetite 2  Feeling bad or failure about yourself  2  Trouble concentrating 2  Moving slowly or fidgety/restless 2  Suicidal thoughts 0  PHQ-9 Score 14     Review of Systems  Constitutional: Positive for fatigue.  HENT: Negative.   Eyes: Negative.   Respiratory: Negative.   Cardiovascular: Negative.   Gastrointestinal: Negative.   Endocrine: Negative.   Genitourinary: Negative.   Musculoskeletal: Positive for myalgias, back pain, arthralgias and neck pain.  Allergic/Immunologic: Negative.   Neurological:       Spasms  Hematological: Negative.   Psychiatric/Behavioral: Positive for dysphoric mood. The patient is nervous/anxious.  Objective:   Physical Exam  Constitutional: She is oriented to person, place, and time. She appears well-developed and well-nourished.  HENT:  Head: Normocephalic and atraumatic.  Neck: Normal range of motion. Neck supple.  Cardiovascular: Normal rate and regular rhythm.   Pulmonary/Chest: Effort normal and breath sounds normal.  Musculoskeletal:  Normal Muscle Bulk and Muscle Testing Reveals: Upper Extremities: Full ROM and Muscle Strength 5/5 Lumbar Paraspinal Tenderness: L-4- L-5 Lower Extremities: Full ROM and Muscle Strength 5/5 Arises from chair slowly Antalgic Gait  Neurological: She is alert and oriented to person,  place, and time.  Skin: Skin is warm and dry.  Nursing note and vitals reviewed.         Assessment & Plan:  1. Lumbar Spondylosis and Degenerative Disc Disease:  Refilled: Hydrocodone 5/325mg  one tablet every 8 hours #90. Second script given to accommodate scheduled appointment. 2. Right ankle pain with hx of remote ankle fracture: No complaints today. [facility]: Dr. Victorino Dike Following  15 minutes of face to face patient care time was spent during this visit. All questions were encouraged and answered.   F/U in 1 month

## 2015-02-25 ENCOUNTER — Ambulatory Visit: Payer: Medicaid Other | Admitting: Physical Medicine & Rehabilitation

## 2015-02-25 ENCOUNTER — Other Ambulatory Visit: Payer: Self-pay | Admitting: Physical Medicine & Rehabilitation

## 2015-03-10 ENCOUNTER — Other Ambulatory Visit: Payer: Self-pay | Admitting: Physical Medicine & Rehabilitation

## 2015-03-31 ENCOUNTER — Other Ambulatory Visit: Payer: Self-pay | Admitting: Physical Medicine & Rehabilitation

## 2015-04-07 ENCOUNTER — Encounter: Payer: Self-pay | Admitting: Registered Nurse

## 2015-04-07 ENCOUNTER — Encounter: Payer: Medicaid Other | Attending: Physical Medicine & Rehabilitation | Admitting: Registered Nurse

## 2015-04-07 VITALS — BP 120/85 | HR 89 | Resp 14

## 2015-04-07 DIAGNOSIS — Z5181 Encounter for therapeutic drug level monitoring: Secondary | ICD-10-CM | POA: Diagnosis present

## 2015-04-07 DIAGNOSIS — M5136 Other intervertebral disc degeneration, lumbar region: Secondary | ICD-10-CM

## 2015-04-07 DIAGNOSIS — M533 Sacrococcygeal disorders, not elsewhere classified: Secondary | ICD-10-CM

## 2015-04-07 DIAGNOSIS — G894 Chronic pain syndrome: Secondary | ICD-10-CM | POA: Diagnosis not present

## 2015-04-07 DIAGNOSIS — M51369 Other intervertebral disc degeneration, lumbar region without mention of lumbar back pain or lower extremity pain: Secondary | ICD-10-CM

## 2015-04-07 DIAGNOSIS — Z79899 Other long term (current) drug therapy: Secondary | ICD-10-CM | POA: Diagnosis not present

## 2015-04-07 MED ORDER — MELOXICAM 15 MG PO TABS: 15.0000 mg | ORAL_TABLET | Freq: Every day | ORAL | Status: DC

## 2015-04-07 MED ORDER — HYDROCODONE-ACETAMINOPHEN 5-325 MG PO TABS: 1.0000 | ORAL_TABLET | Freq: Three times a day (TID) | ORAL | Status: AC | PRN

## 2015-04-07 MED ORDER — DULOXETINE HCL 30 MG PO CPEP: ORAL_CAPSULE | ORAL | Status: DC

## 2015-04-07 NOTE — Progress Notes (Signed)
Subjective:    Patient ID: Latoya Collier, female    DOB: 06/24/1961, 54 y.o.   MRN: [id number]  HPI: Latoya Collier is a 54 year old female who returns for follow up for chronic pain and medication refill. She says her pain is located in her mid-lower back and right ankle. She rates her pain 7. Her current exercise regime is walking and performing stretching exercises.   Pain Inventory Average Pain 8 Pain Right Now 7 My pain is sharp, burning, dull, stabbing and aching  In the last 24 hours, has pain interfered with the following? General activity 8 Relation with others 8 Enjoyment of life 6 What TIME of day is your pain at its worst? morning, daytime  Sleep (in general) Fair  Pain is worse with: walking, bending, sitting, inactivity, standing and some activites Pain improves with: medication Relief from Meds: 5  Mobility walk without assistance  Function disabled: date disabled .  Neuro/Psych depression anxiety  Prior Studies Any changes since last visit?  no  Physicians involved in your care Any changes since last visit?  no  Social History   Social History  . Marital Status: Divorced    Spouse Name: N/A  . Number of Children: N/A  . Years of Education: N/A   Social History Main Topics  . Smoking status: Current Every Day Smoker -- 0.30 packs/day for 30 years  . Smokeless tobacco: Never Used     Comment: cutting back  . Alcohol Use: No     Comment: occ glass of wine  . Drug Use: No  . Sexual Activity: Not Asked   Other Topics Concern  . None   Social History Narrative   Past Medical History  Diagnosis Date  . Arthritis   . Abnormal cells of cervix   . Chronic back pain    BP 120/85 mmHg  Pulse 89  Resp 14  SpO2 96%  Opioid Risk Score:   Fall Risk Score:  `1  Depression screen PHQ 2/9 Depression screen PHQ 2/9 09/24/2014  Decreased Interest 1  Down, Depressed, Hopeless 1  PHQ - 2 Score 2  Altered sleeping 2  Tired, decreased  energy 2  Change in appetite 2  Feeling bad or failure about yourself  2  Trouble concentrating 2  Moving slowly or fidgety/restless 2  Suicidal thoughts 0  PHQ-9 Score 14   Review of Systems  Psychiatric/Behavioral: Positive for dysphoric mood. The patient is nervous/anxious.   All other systems reviewed and are negative.      Objective:   Physical Exam  Constitutional: She is oriented to person, place, and time. She appears well-developed and well-nourished.  HENT:  Head: Normocephalic and atraumatic.  Neck: Normal range of motion. Neck supple.  Cardiovascular: Normal rate and regular rhythm.   Pulmonary/Chest: Effort normal and breath sounds normal.  Musculoskeletal:  Normal Muscle Bulk and Muscle Testing Reveals: Upper Extremities: Full ROM and Muscle Strength 5/5 Back without spinal or paraspinal tenderness Lower Extremities: Full ROM and Muscle Strength 5/5 Right Ankle Brace Intact  Antalgic Gait  Neurological: She is alert and oriented to person, place, and time.  Skin: Skin is warm and dry.  Psychiatric: She has a normal mood and affect.  Nursing note and vitals reviewed.         Assessment & Plan:  1. Lumbar Spondylosis and Degenerative Disc Disease:  Refilled: Hydrocodone 5/325mg  one tablet every 8 hours #90.  2. Right ankle pain with hx of  remote ankle fracture: [facility]: Dr. Victorino Dike Following  15 minutes of face to face patient care time was spent during this visit. All questions were encouraged and answered.   F/U in 1 month

## 2015-04-21 NOTE — Progress Notes (Addendum)
Urine drug screen for this encounter is consistent for prescribed medication but is positive for cocaine as well.  Per Dr Wynn BankerKirsteins, discharge from clinic for violation of CSA. [facility].

## 2015-04-25 ENCOUNTER — Other Ambulatory Visit: Payer: Self-pay | Admitting: Physical Medicine & Rehabilitation

## 2015-04-25 ENCOUNTER — Other Ambulatory Visit: Payer: Self-pay | Admitting: *Deleted

## 2015-04-28 ENCOUNTER — Encounter: Payer: Self-pay | Admitting: Physical Medicine & Rehabilitation

## 2015-05-05 ENCOUNTER — Encounter: Payer: Medicaid Other | Admitting: Registered Nurse

## 2015-05-06 ENCOUNTER — Ambulatory Visit: Payer: Medicaid Other | Admitting: Registered Nurse

## 2015-05-12 ENCOUNTER — Telehealth: Payer: Self-pay | Admitting: Physical Medicine & Rehabilitation

## 2015-05-12 NOTE — Telephone Encounter (Signed)
Patient would like a call back in reference to her urine drug screen.  She thinks there is a mistake and she has spoken to [facility] and they told her that it could have been read wrong and needed to call our office.  She is very upset and would like a call back.

## 2015-05-13 NOTE — Telephone Encounter (Signed)
LVM for patient letting her know we got her message.

## 2015-05-25 ENCOUNTER — Telehealth: Payer: Self-pay | Admitting: Physical Medicine & Rehabilitation

## 2015-05-25 NOTE — Telephone Encounter (Signed)
LVM that there was nothing else I could help her with since her UDS was positive for cocaine. I explained on her voicemail that the discharge stood and we would no longer be providing care.

## 2015-05-25 NOTE — Telephone Encounter (Signed)
Patient has left messages for a call back about her discharge, would like a call back.

## 2015-05-30 ENCOUNTER — Telehealth: Payer: Self-pay

## 2015-05-30 NOTE — Telephone Encounter (Signed)
Pt stated that she would like to know what she can do to get her refills. She states that she has two phone numbers to call to prove that her UDS cocaine pos results could be a false positive. Please advise pt.

## 2015-05-31 NOTE — Telephone Encounter (Signed)
Patient called again concerned about her urine drug screen.  She is concerned about what she does now to get her medication.  Please advise.

## 2015-06-04 ENCOUNTER — Other Ambulatory Visit: Payer: Self-pay | Admitting: Physical Medicine & Rehabilitation

## 2015-06-06 NOTE — Telephone Encounter (Signed)
Please call patient and let her know we have spoken to the lab and there is nothing the toxicologist is aware of that will cause a false positive for cocaine. And as she has been told due to the illicit drug on her UDS, [facility] will not provide any prescriptions. She will need to contact her PCP or go to an urgent care for her medications.

## 2015-06-08 NOTE — Telephone Encounter (Signed)
LMTCB.. MAH 

## 2015-06-08 NOTE — Telephone Encounter (Signed)
Returning Latoya Collier's call.  Patient is also is getting her flexeril and meloxicam filled by her primary however they will not be back in office until December 6/7 and will need refills on cymbalta, tramadol and clonazepam.

## 2015-06-08 NOTE — Telephone Encounter (Signed)
Please advise 

## 2015-06-09 NOTE — Telephone Encounter (Signed)
Please advise patient that will not be providing any prescriptions. She has been told this over and over. [facility] has said no medications will be provided with a UDS positive for cocaine.

## 2015-06-10 NOTE — Telephone Encounter (Signed)
Left message for pt advising that we can no longer refill her medications.

## 2015-06-13 ENCOUNTER — Telehealth: Payer: Self-pay

## 2015-06-13 NOTE — Telephone Encounter (Signed)
Pt returned my phone call this morning. She states in the message: "I am aware that you guys will not refill my medications anymore, I don't get them filled there anymore. However, I thought [facility] would reconsider filling my medications because of the error with the drug screening company. It's your issue and your problem to get it resolved. I want to know what it is that your office is doing to clear me from this situation."

## 2015-06-14 NOTE — Telephone Encounter (Signed)
Left voicemail letting patient know I was glad someone was refilling her medications since we will not. I also stated in the message that I spoke with [facility] and representatives from [facility] and no one is aware of anything that can throw a false positive for cocaine. I told her unless we received something from [facility] stating the test was invalid, we have to stand with the results. I stated there was nothing else we could do at this point.

## 2015-07-12 ENCOUNTER — Emergency Department (INDEPENDENT_AMBULATORY_CARE_PROVIDER_SITE_OTHER)
Admission: EM | Admit: 2015-07-12 | Discharge: 2015-07-12 | Disposition: A | Discharge status: Home / Self Care | Payer: Medicaid Other | Source: Home / Self Care | Attending: Emergency Medicine | Admitting: Emergency Medicine

## 2015-07-12 ENCOUNTER — Encounter (HOSPITAL_COMMUNITY): Payer: Self-pay | Admitting: Emergency Medicine

## 2015-07-12 DIAGNOSIS — J9801 Acute bronchospasm: Secondary | ICD-10-CM

## 2015-07-12 DIAGNOSIS — Z720 Tobacco use: Secondary | ICD-10-CM | POA: Diagnosis not present

## 2015-07-12 DIAGNOSIS — J3489 Other specified disorders of nose and nasal sinuses: Secondary | ICD-10-CM | POA: Diagnosis not present

## 2015-07-12 MED ORDER — PREDNISONE 20 MG PO TABS: ORAL_TABLET | ORAL | Status: DC

## 2015-07-12 MED ORDER — ALBUTEROL SULFATE HFA 108 (90 BASE) MCG/ACT IN AERS: 2.0000 | INHALATION_SPRAY | RESPIRATORY_TRACT | Status: AC | PRN

## 2015-07-12 NOTE — ED Provider Notes (Signed)
CSN: [id number]     Arrival date & time 07/12/15  1412 History   First MD Initiated Contact with Patient 07/12/15 1640     Chief Complaint  Patient presents with  . URI   (Consider location/radiation/quality/duration/timing/severity/associated sxs/prior Treatment) HPI Comments: 55 year old female complaining of chest congestion and PND. She is also having a cough is worse at nighttime. She often feels hot and cold. She is a smoker. And has been smoking for several years. Denies fever. And she has never had to use an inhaler.   Past Medical History  Diagnosis Date  . Arthritis   . Abnormal cells of cervix   . Chronic back pain    Past Surgical History  Procedure Laterality Date  . Colonoscopy    . Diagnostic mammogram     No family history on file. Social History  Substance Use Topics  . Smoking status: Current Every Day Smoker -- 0.30 packs/day for 30 years  . Smokeless tobacco: Never Used     Comment: cutting back  . Alcohol Use: No     Comment: occ glass of wine   OB History    No data available     Review of Systems  Constitutional: Negative for fever, chills and activity change.  HENT: Positive for postnasal drip, rhinorrhea and sore throat.   Eyes: Positive for itching. Negative for pain, discharge, redness and visual disturbance.       Complains of small papular lesions just below the right lower eyelid. She states they are fading away.  Respiratory: Positive for cough. Negative for chest tightness and shortness of breath.   Cardiovascular: Negative.   Gastrointestinal: Negative.   Musculoskeletal: Positive for back pain. Negative for myalgias and neck pain.  Skin: Negative for rash.  Neurological: Negative.   All other systems reviewed and are negative.   Allergies  Wellbutrin  Home Medications   Prior to Admission medications   Medication Sig Start Date End Date Taking? Authorizing Provider  clonazePAM (KLONOPIN) 0.5 MG tablet TAKE 1 TABLET BY MOUTH  TWICE DAILY AS NEEDED 03/31/15  Yes Erick Colace, MD  cyclobenzaprine (FLEXERIL) 10 MG tablet TAKE 1 TABLET BY MOUTH ONCE DAILY AS NEEDED 03/31/15  Yes Erick Colace, MD  albuterol (PROVENTIL HFA;VENTOLIN HFA) 108 (90 Base) MCG/ACT inhaler Inhale 2 puffs into the lungs every 4 (four) hours as needed for wheezing or shortness of breath. 07/12/15   Hayden Rasmussen, NP  cyclobenzaprine (FLEXERIL) 10 MG tablet TAKE ONE TABLET BY MOUTH TWICE DAILY 04/25/15   Erick Colace, MD  DULoxetine (CYMBALTA) 30 MG capsule TAKE 1 CAPSULE BY MOUTH 2 TIMES DAILY. 04/07/15   Jones Bales, NP  HYDROcodone-acetaminophen (NORCO/VICODIN) 5-325 MG tablet Take 1 tablet by mouth every 8 (eight) hours as needed. 04/07/15   Jones Bales, NP  meloxicam (MOBIC) 15 MG tablet Take 1 tablet (15 mg total) by mouth daily. 04/07/15   Jones Bales, NP  predniSONE (DELTASONE) 20 MG tablet 3 Tabs PO Days 1-3, then 2 tabs PO Days 4-6, then 1 tab PO Day 7-9, then Half Tab PO Day 10-12 07/12/15   Hayden Rasmussen, NP  traMADol (ULTRAM) 50 MG tablet TAKE 1 TABLET BY MOUTH 3 TIMES DAILY 02/28/15   Erick Colace, MD   Meds Ordered and Administered this Visit  Medications - No data to display  BP 127/77 mmHg  Pulse 90  Temp(Src) 97.5 F (36.4 C) (Oral)  Resp 16  SpO2 96% No data found.  Physical Exam  Constitutional: She is oriented to person, place, and time. She appears well-developed and well-nourished. No distress.  HENT:  Mouth/Throat: No oropharyngeal exudate.  Bilateral TMs are normal Oropharynx with minor erythema and scant cobblestoning. Positive clear PND.  Eyes: Conjunctivae and EOM are normal.  Neck: Normal range of motion. Neck supple.  Cardiovascular: Normal rate, regular rhythm and normal heart sounds.   Pulmonary/Chest: Effort normal. No respiratory distress. She has wheezes. She has no rales.  Faint wheezes bilaterally with forced expiration. Mildly prolonged expiratory phase.  Musculoskeletal: She  exhibits no edema.  Lymphadenopathy:    She has no cervical adenopathy.  Neurological: She is alert and oriented to person, place, and time.  Skin: Skin is warm and dry. No rash noted.  Psychiatric: She has a normal mood and affect.  Nursing note and vitals reviewed.   ED Course  Procedures (including critical care time)  Labs Review Labs Reviewed - No data to display  Imaging Review No results found.   Visual Acuity Review  Right Eye Distance:   Left Eye Distance:   Bilateral Distance:    Right Eye Near:   Left Eye Near:    Bilateral Near:         MDM   1. Sinus drainage   2. Cough due to bronchospasm   3. Tobacco abuse disorder    For drainage may take Allegra or Claritin or Zyrtec. May use a copious amount of saline nasal spray as needed. Drink plenty of fluids stay well-hydrated Stop smoking Prednisone taper dose. Take with food Albuterol inhaler 2 puffs every 4 hours as needed for cough and wheeze. The medical assistant noted in his notes that she had requested a refill on controlled substances. During the interview I did not ask her about this request nor did she mention anything to me about it.   Hayden Rasmussenavid Agape Hardiman, NP 07/12/15 1729

## 2015-07-12 NOTE — Discharge Instructions (Signed)
Bronchospasm, Adult A bronchospasm is a spasm or tightening of the airways going into the lungs. During a bronchospasm breathing becomes more difficult because the airways get smaller. When this happens there can be coughing, a whistling sound when breathing (wheezing), and difficulty breathing. Bronchospasm is often associated with asthma, but not all patients who experience a bronchospasm have asthma. CAUSES  A bronchospasm is caused by inflammation or irritation of the airways. The inflammation or irritation may be triggered by:   Allergies (such as to animals, pollen, food, or mold). Allergens that cause bronchospasm may cause wheezing immediately after exposure or many hours later.   Infection. Viral infections are believed to be the most common cause of bronchospasm.   Exercise.   Irritants (such as pollution, cigarette smoke, strong odors, aerosol sprays, and paint fumes).   Weather changes. Winds increase molds and pollens in the air. Rain refreshes the air by washing irritants out. Cold air may cause inflammation.   Stress and emotional upset.  SIGNS AND SYMPTOMS   Wheezing.   Excessive nighttime coughing.   Frequent or severe coughing with a simple cold.   Chest tightness.   Shortness of breath.  DIAGNOSIS  Bronchospasm is usually diagnosed through a history and physical exam. Tests, such as chest X-rays, are sometimes done to look for other conditions. TREATMENT   Inhaled medicines can be given to open up your airways and help you breathe. The medicines can be given using either an inhaler or a nebulizer machine.  Corticosteroid medicines may be given for severe bronchospasm, usually when it is associated with asthma. HOME CARE INSTRUCTIONS   Always have a plan prepared for seeking medical care. Know when to call your health care provider and local emergency services (911 in the [place] Know where you can access local emergency care.  Only take medicines as  directed by your health care provider.  If you were prescribed an inhaler or nebulizer machine, ask your health care provider to explain how to use it correctly. Always use a spacer with your inhaler if you were given one.  It is necessary to remain calm during an attack. Try to relax and breathe more slowly.  Control your home environment in the following ways:   Change your heating and air conditioning filter at least once a month.   Limit your use of fireplaces and wood stoves.  Do not smoke and do not allow smoking in your home.   Avoid exposure to perfumes and fragrances.   Get rid of pests (such as roaches and mice) and their droppings.   Throw away plants if you see mold on them.   Keep your house clean and dust free.   Replace carpet with wood, tile, or vinyl flooring. Carpet can trap dander and dust.   Use allergy-proof pillows, mattress covers, and box spring covers.   Wash bed sheets and blankets every week in hot water and dry them in a dryer.   Use blankets that are made of polyester or cotton.   Wash hands frequently. SEEK MEDICAL CARE IF:   You have muscle aches.   You have chest pain.   The sputum changes from clear or white to yellow, green, gray, or bloody.   The sputum you cough up gets thicker.   There are problems that may be related to the medicine you are given, such as a rash, itching, swelling, or trouble breathing.  SEEK IMMEDIATE MEDICAL CARE IF:   You have worsening wheezing and coughing  even after taking your prescribed medicines.   You have increased difficulty breathing.   You develop severe chest pain. MAKE SURE YOU:   Understand these instructions.  Will watch your condition.  Will get help right away if you are not doing well or get worse.   This information is not intended to replace advice given to you by your health care provider. Make sure you discuss any questions you have with your health care  provider.   Document Released: 06/28/2003 Document Revised: 07/16/2014 Document Reviewed: 12/15/2012 Elsevier Interactive Patient Education 2016 [employer].  How to Use an Inhaler Using your inhaler correctly is very important. Good technique will make sure that the medicine reaches your lungs.  HOW TO USE AN INHALER:  Take the cap off the inhaler.  If this is the first time using your inhaler, you need to prime it. Shake the inhaler for 5 seconds. Release four puffs into the air, away from your face. Ask your doctor for help if you have questions.  Shake the inhaler for 5 seconds.  Turn the inhaler so the bottle is above the mouthpiece.  Put your pointer finger on top of the bottle. Your thumb holds the bottom of the inhaler.  Open your mouth.  Either hold the inhaler away from your mouth (the width of 2 fingers) or place your lips tightly around the mouthpiece. Ask your doctor which way to use your inhaler.  Breathe out as much air as possible.  Breathe in and push down on the bottle 1 time to release the medicine. You will feel the medicine go in your mouth and throat.  Continue to take a deep breath in very slowly. Try to fill your lungs.  After you have breathed in completely, hold your breath for 10 seconds. This will help the medicine to settle in your lungs. If you cannot hold your breath for 10 seconds, hold it for as long as you can before you breathe out.  Breathe out slowly, through pursed lips. Whistling is an example of pursed lips.  If your doctor has told you to take more than 1 puff, wait at least 15-30 seconds between puffs. This will help you get the best results from your medicine. Do not use the inhaler more than your doctor tells you to.  Put the cap back on the inhaler.  Follow the directions from your doctor or from the inhaler package about cleaning the inhaler. If you use more than one inhaler, ask your doctor which inhalers to use and what order to  use them in. Ask your doctor to help you figure out when you will need to refill your inhaler.  If you use a steroid inhaler, always rinse your mouth with water after your last puff, gargle and spit out the water. Do not swallow the water. GET HELP IF:  The inhaler medicine only partially helps to stop wheezing or shortness of breath.  You are having trouble using your inhaler.  You have some increase in thick spit (phlegm). GET HELP RIGHT AWAY IF:  The inhaler medicine does not help your wheezing or shortness of breath or you have tightness in your chest.  You have dizziness, headaches, or fast heart rate.  You have chills, fever, or night sweats.  You have a large increase of thick spit, or your thick spit is bloody. MAKE SURE YOU:   Understand these instructions.  Will watch your condition.  Will get help right away if you are not  doing well or get worse.   This information is not intended to replace advice given to you by your health care provider. Make sure you discuss any questions you have with your health care provider.   Document Released: 04/03/2008 Document Revised: 04/15/2013 Document Reviewed: 01/22/2013 Elsevier Interactive Patient Education 2016 Elsevier Inc.  Upper Respiratory Infection, Adult For drainage may take Allegra or Claritin or Zyrtec. May use a copious amount of saline nasal spray as needed. Drink plenty of fluids stay well-hydrated Stop smoking Most upper respiratory infections (URIs) are caused by a virus. A URI affects the nose, throat, and upper air passages. The most common type of URI is often called "the common cold." HOME CARE   Take medicines only as told by your doctor.  Gargle warm saltwater or take cough drops to comfort your throat as told by your doctor.  Use a warm mist humidifier or inhale steam from a shower to increase air moisture. This may make it easier to breathe.  Drink enough fluid to keep your pee (urine) clear or pale  yellow.  Eat soups and other clear broths.  Have a healthy diet.  Rest as needed.  Go back to work when your fever is gone or your doctor says it is okay.  You may need to stay home longer to avoid giving your URI to others.  You can also wear a face mask and wash your hands often to prevent spread of the virus.  Use your inhaler more if you have asthma.  Do not use any tobacco products, including cigarettes, chewing tobacco, or electronic cigarettes. If you need help quitting, ask your doctor. GET HELP IF:  You are getting worse, not better.  Your symptoms are not helped by medicine.  You have chills.  You are getting more short of breath.  You have brown or red mucus.  You have yellow or brown discharge from your nose.  You have pain in your face, especially when you bend forward.  You have a fever.  You have puffy (swollen) neck glands.  You have pain while swallowing.  You have white areas in the back of your throat. GET HELP RIGHT AWAY IF:   You have very bad or constant:  Headache.  Ear pain.  Pain in your forehead, behind your eyes, and over your cheekbones (sinus pain).  Chest pain.  You have long-lasting (chronic) lung disease and any of the following:  Wheezing.  Long-lasting cough.  Coughing up blood.  A change in your usual mucus.  You have a stiff neck.  You have changes in your:  Vision.  Hearing.  Thinking.  Mood. MAKE SURE YOU:   Understand these instructions.  Will watch your condition.  Will get help right away if you are not doing well or get worse.   This information is not intended to replace advice given to you by your health care provider. Make sure you discuss any questions you have with your health care provider.   Document Released: 12/12/2007 Document Revised: 11/09/2014 Document Reviewed: 09/30/2013 Elsevier Interactive Patient Education [employer] Elsevier Inc.

## 2015-07-12 NOTE — ED Notes (Signed)
C/o cold sx onset x3 weeks associated w/prod cough, sneezing, hot/cold chills, congestion Also states she is needing refill on her controlled substance meds Has appt w/pain clinic on 1/19 A&O x4... No acute distress

## 2015-10-12 ENCOUNTER — Encounter: Payer: Self-pay | Admitting: Physical Medicine & Rehabilitation

## 2015-12-06 ENCOUNTER — Other Ambulatory Visit: Payer: Self-pay

## 2015-12-06 DIAGNOSIS — Z1231 Encounter for screening mammogram for malignant neoplasm of breast: Secondary | ICD-10-CM

## 2015-12-14 ENCOUNTER — Ambulatory Visit: Payer: Medicaid Other

## 2015-12-21 ENCOUNTER — Ambulatory Visit
Admission: RE | Admit: 2015-12-21 | Discharge: 2015-12-21 | Disposition: A | Discharge status: Home / Self Care | Payer: Medicaid Other | Source: Ambulatory Visit

## 2015-12-21 DIAGNOSIS — Z1231 Encounter for screening mammogram for malignant neoplasm of breast: Secondary | ICD-10-CM

## 2017-01-30 ENCOUNTER — Other Ambulatory Visit: Payer: Self-pay | Admitting: Family Medicine

## 2017-01-30 DIAGNOSIS — Z1231 Encounter for screening mammogram for malignant neoplasm of breast: Secondary | ICD-10-CM

## 2017-02-01 ENCOUNTER — Ambulatory Visit: Payer: Medicaid Other

## 2017-11-25 ENCOUNTER — Other Ambulatory Visit: Payer: Self-pay | Admitting: Family Medicine

## 2017-11-25 DIAGNOSIS — Z1231 Encounter for screening mammogram for malignant neoplasm of breast: Secondary | ICD-10-CM

## 2017-12-17 ENCOUNTER — Ambulatory Visit
Admission: RE | Admit: 2017-12-17 | Discharge: 2017-12-17 | Disposition: A | Discharge status: Home / Self Care | Payer: Medicaid Other | Source: Ambulatory Visit | Attending: Family Medicine | Admitting: Family Medicine

## 2017-12-17 DIAGNOSIS — Z1231 Encounter for screening mammogram for malignant neoplasm of breast: Secondary | ICD-10-CM

## 2017-12-18 ENCOUNTER — Other Ambulatory Visit: Payer: Self-pay | Admitting: Family Medicine

## 2017-12-18 DIAGNOSIS — R928 Other abnormal and inconclusive findings on diagnostic imaging of breast: Secondary | ICD-10-CM

## 2017-12-23 ENCOUNTER — Other Ambulatory Visit: Payer: Self-pay | Admitting: Family Medicine

## 2017-12-23 ENCOUNTER — Ambulatory Visit
Admission: RE | Admit: 2017-12-23 | Discharge: 2017-12-23 | Disposition: A | Discharge status: Home / Self Care | Payer: Medicaid Other | Source: Ambulatory Visit | Attending: Family Medicine | Admitting: Family Medicine

## 2017-12-23 DIAGNOSIS — R928 Other abnormal and inconclusive findings on diagnostic imaging of breast: Secondary | ICD-10-CM

## 2017-12-23 DIAGNOSIS — N6320 Unspecified lump in the left breast, unspecified quadrant: Secondary | ICD-10-CM

## 2017-12-27 ENCOUNTER — Other Ambulatory Visit: Payer: Self-pay | Admitting: Family Medicine

## 2018-06-25 ENCOUNTER — Other Ambulatory Visit: Payer: Medicaid Other

## 2018-06-26 ENCOUNTER — Other Ambulatory Visit: Payer: Medicaid Other

## 2018-07-04 ENCOUNTER — Ambulatory Visit
Admission: RE | Admit: 2018-07-04 | Discharge: 2018-07-04 | Disposition: A | Discharge status: Home / Self Care | Payer: Medicaid Other | Source: Ambulatory Visit | Attending: Family Medicine | Admitting: Family Medicine

## 2018-07-04 ENCOUNTER — Other Ambulatory Visit: Payer: Self-pay | Admitting: Family Medicine

## 2018-07-04 DIAGNOSIS — N6320 Unspecified lump in the left breast, unspecified quadrant: Secondary | ICD-10-CM

## 2018-08-22 ENCOUNTER — Encounter (HOSPITAL_COMMUNITY): Payer: Self-pay

## 2018-08-22 ENCOUNTER — Other Ambulatory Visit: Payer: Self-pay

## 2018-08-22 DIAGNOSIS — K279 Peptic ulcer, site unspecified, unspecified as acute or chronic, without hemorrhage or perforation: Secondary | ICD-10-CM | POA: Diagnosis not present

## 2018-08-22 DIAGNOSIS — F17210 Nicotine dependence, cigarettes, uncomplicated: Secondary | ICD-10-CM | POA: Insufficient documentation

## 2018-08-22 DIAGNOSIS — R7989 Other specified abnormal findings of blood chemistry: Secondary | ICD-10-CM | POA: Insufficient documentation

## 2018-08-22 DIAGNOSIS — Z79899 Other long term (current) drug therapy: Secondary | ICD-10-CM | POA: Insufficient documentation

## 2018-08-22 DIAGNOSIS — R1033 Periumbilical pain: Secondary | ICD-10-CM | POA: Diagnosis present

## 2018-08-22 LAB — CBC
HEMATOCRIT: 43.4 % (ref 36.0–46.0)
HEMOGLOBIN: 14 g/dL (ref 12.0–15.0)
MCH: 30.4 pg (ref 26.0–34.0)
MCHC: 32.3 g/dL (ref 30.0–36.0)
MCV: 94.3 fL (ref 80.0–100.0)
NRBC: 0 % (ref 0.0–0.2)
PLATELETS: 321 10*3/uL (ref 150–400)
RBC: 4.6 MIL/uL (ref 3.87–5.11)
RDW: 13.8 % (ref 11.5–15.5)
WBC: 10.4 10*3/uL (ref 4.0–10.5)

## 2018-08-22 LAB — URINALYSIS, ROUTINE W REFLEX MICROSCOPIC
BILIRUBIN URINE: NEGATIVE
Bacteria, UA: NONE SEEN
Glucose, UA: NEGATIVE mg/dL
KETONES UR: NEGATIVE mg/dL
LEUKOCYTE UA: NEGATIVE
Nitrite: NEGATIVE
PH: 5 (ref 5.0–8.0)
Protein, ur: NEGATIVE mg/dL
Specific Gravity, Urine: 1.02 (ref 1.005–1.030)

## 2018-08-22 LAB — COMPREHENSIVE METABOLIC PANEL
ALBUMIN: 4.3 g/dL (ref 3.5–5.0)
ALT: 15 U/L (ref 0–44)
AST: 17 U/L (ref 15–41)
Alkaline Phosphatase: 80 U/L (ref 38–126)
Anion gap: 8 (ref 5–15)
BILIRUBIN TOTAL: 0.5 mg/dL (ref 0.3–1.2)
BUN: 15 mg/dL (ref 6–20)
CHLORIDE: 105 mmol/L (ref 98–111)
CO2: 25 mmol/L (ref 22–32)
CREATININE: 0.83 mg/dL (ref 0.44–1.00)
Calcium: 9 mg/dL (ref 8.9–10.3)
GFR calc Af Amer: >60 mL/min (ref >60)
GFR calc non Af Amer: >60 mL/min (ref >60)
GLUCOSE: 113 mg/dL — AB (ref 70–99)
Potassium: 3.8 mmol/L (ref 3.5–5.1)
Sodium: 138 mmol/L (ref 135–145)
Total Protein: 7.3 g/dL (ref 6.5–8.1)

## 2018-08-22 LAB — I-STAT BETA HCG BLOOD, ED (MC, WL, AP ONLY): I-stat hCG, quantitative: 7.7 m[IU]/mL — ABNORMAL HIGH (ref <5)

## 2018-08-22 LAB — LIPASE, BLOOD: Lipase: 99 U/L — ABNORMAL HIGH (ref 11–51)

## 2018-08-22 MED ORDER — SODIUM CHLORIDE 0.9% FLUSH: 3.0000 mL | Freq: Once | INTRAVENOUS | Status: DC

## 2018-08-22 NOTE — ED Notes (Signed)
Urine culture sent to the lab. 

## 2018-08-22 NOTE — ED Triage Notes (Signed)
Pt reports abdominal pain that has been consistent through the last 3 weeks. She reports a hx of H. Pylori. UC sent her over here concerned for a bowel blockage. Pt denies N/V. Had diarrhea yesterday.

## 2018-08-23 ENCOUNTER — Emergency Department (HOSPITAL_COMMUNITY)
Admission: EM | Admit: 2018-08-23 | Discharge: 2018-08-23 | Disposition: A | Discharge status: Home / Self Care | Payer: Medicaid Other | Attending: Emergency Medicine | Admitting: Emergency Medicine

## 2018-08-23 DIAGNOSIS — K279 Peptic ulcer, site unspecified, unspecified as acute or chronic, without hemorrhage or perforation: Secondary | ICD-10-CM

## 2018-08-23 DIAGNOSIS — R748 Abnormal levels of other serum enzymes: Secondary | ICD-10-CM

## 2018-08-23 MED ORDER — HYDROXYZINE HCL 25 MG PO TABS
25.0000 mg | ORAL_TABLET | Freq: Once | ORAL | Status: AC
  Administered 2018-08-23: 25 mg via ORAL
  Filled 2018-08-23: qty 1

## 2018-08-23 MED ORDER — PREDNISONE 10 MG PO TABS: 50.0000 mg | ORAL_TABLET | Freq: Every day | ORAL | 0 refills | Status: AC

## 2018-08-23 MED ORDER — PREDNISONE 20 MG PO TABS
60.0000 mg | ORAL_TABLET | Freq: Once | ORAL | Status: AC
  Administered 2018-08-23: 60 mg via ORAL
  Filled 2018-08-23: qty 3

## 2018-08-23 MED ORDER — SUCRALFATE 1 GM/10ML PO SUSP: 1.0000 g | Freq: Three times a day (TID) | ORAL | 0 refills | Status: AC

## 2018-08-23 MED ORDER — SUCRALFATE 1 G PO TABS
1.0000 g | ORAL_TABLET | Freq: Once | ORAL | Status: AC
  Administered 2018-08-23: 1 g via ORAL
  Filled 2018-08-23: qty 1

## 2018-08-23 MED ORDER — OMEPRAZOLE 40 MG PO CPDR: 40.0000 mg | DELAYED_RELEASE_CAPSULE | Freq: Every day | ORAL | 0 refills | Status: AC

## 2018-08-23 MED ORDER — HYDROXYZINE HCL 25 MG PO TABS: 25.0000 mg | ORAL_TABLET | Freq: Four times a day (QID) | ORAL | 0 refills | Status: AC

## 2018-08-23 NOTE — Discharge Instructions (Signed)
We saw you in the ER for the abdominal discomfort and itching. All the results in the ER are normal, labs and imaging besides slightly elevated lipase, which we think could be because of gastritis.  We are not sure what is causing her itching, given that there have been some new medication it is possible that you are having allergic reaction to one of them.  The workup in the ER is not complete, and is limited to screening for life threatening and emergent conditions only, so please see GI doctor for further evaluation along with your primary care doctor.  Return to the ER immediately if you start having any difficulty in breathing or if you start having severe nausea, vomiting, diarrhea, bloody stools or worsening abdominal pain.

## 2018-08-23 NOTE — ED Provider Notes (Signed)
[facility]-EMERGENCY DEPT Provider Note   CSN: [id number] Arrival date & time: 08/22/18  2132     History   Chief Complaint Chief Complaint  Patient presents with  . Abdominal Pain  . Pruritis    HPI Latoya Collier is a 58 y.o. female.  HPI  58 year old female comes in with chief complaint of abdominal pain. Patient has history of H. pylori gastritis.  She reports that she started having current abdominal pain about 10 days ago.  Her pain is located in the periumbilical region and described as burning type pain and cramping pain.  Her pain is similar to her prior H. pylori infection that she had 15 years ago.  Patient is not taking any medications for gastritis at this time.  She is having some heartburn associated with this pain.  Patient denies any nausea, vomiting, fevers, chills.  Patient was seen by her PCP, there were concerns for small bowel obstruction based on acute abdominal series therefore she was asked to come to the ER.  Patient has had normal appetite and denies any vomiting.  Her bowel movements have been irregular, with last BM being 2 days ago.  Patient thinks she has been passing flatus.  There is no history of SBO.  Past Medical History:  Diagnosis Date  . Abnormal cells of cervix   . Arthritis   . Chronic back pain     Patient Active Problem List   Diagnosis Date Noted  . Pain in joint, ankle and Collier 04/02/2013  . Chronic lower back pain 11/16/2011  . 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 Lumbar spondylosis 11/16/2011    Past Surgical History:  Procedure Laterality Date  . COLONOSCOPY    . DIAGNOSTIC MAMMOGRAM       OB History   No obstetric history on file.      Home Medications    Prior to Admission medications   Medication Sig Start Date End Date Taking? Authorizing Provider  albuterol (PROVENTIL HFA;VENTOLIN HFA) 108 (90 Base) MCG/ACT inhaler Inhale 2 puffs into the lungs every 4 (four) hours as  needed for wheezing or shortness of breath. 07/12/15  Yes Mabe, Onalee Hua, NP  AMITIZA 24 MCG capsule Take 24 mcg by mouth daily as needed for constipation.  07/23/18  Yes [provider]  BIOTIN PO Take 1 tablet by mouth daily.   Yes [provider]  buprenorphine (SUBUTEX) 2 MG SUBL SL tablet Place 2 tablets under the tongue 4 (four) times daily. 07/03/18  Yes [provider]  cyclobenzaprine (FLEXERIL) 10 MG tablet TAKE 1 TABLET BY MOUTH ONCE DAILY AS NEEDED Patient taking differently: Take 10 mg by mouth daily as needed for muscle spasms.  03/31/15  Yes Kirsteins, Victorino Sparrow, MD  diphenhydrAMINE HCl (BENADRYL PO) Take 1 capsule by mouth daily as needed (itching).   Yes [provider]  DULoxetine (CYMBALTA) 60 MG capsule Take 60 mg by mouth daily. 06/27/18  Yes [provider]  fluticasone (FLONASE) 50 MCG/ACT nasal spray Place 1-2 sprays into both nostrils daily. 07/23/18  Yes [provider]  gabapentin (NEURONTIN) 600 MG tablet Take 600 mg by mouth 4 (four) times daily. 06/19/18  Yes [provider]  HYDROcodone-acetaminophen (NORCO) 10-325 MG tablet Take 1 tablet by mouth 4 (four) times daily. 07/31/18  Yes [provider]  hydrocortisone cream 1 % Apply 1 application topically 2 (two) times daily.   Yes [provider]  ondansetron (ZOFRAN) 4 MG tablet  Take 4 mg by mouth every 8 (eight) hours as needed for nausea or vomiting.   Yes [provider]  ranitidine (ZANTAC) 150 MG tablet Take 150 mg by mouth daily as needed for heartburn.   Yes [provider]  VANIQA 13.9 % cream Apply 1 application topically 2 (two) times daily. 07/27/18  Yes [provider]  HYDROcodone-acetaminophen (NORCO/VICODIN) 5-325 MG tablet Take 1 tablet by mouth every 8 (eight) hours as needed. Patient not taking: Reported on 08/23/2018 04/07/15   Jones Bales, NP  hydrOXYzine (ATARAX/VISTARIL) 25 MG tablet Take 1 tablet  (25 mg total) by mouth every 6 (six) hours. 08/23/18   Derwood Kaplan, MD  omeprazole (PRILOSEC) 40 MG capsule Take 1 capsule (40 mg total) by mouth daily. 08/23/18   Derwood Kaplan, MD  predniSONE (DELTASONE) 10 MG tablet Take 5 tablets (50 mg total) by mouth daily. 08/23/18   Derwood Kaplan, MD  sucralfate (CARAFATE) 1 GM/10ML suspension Take 10 mLs (1 g total) by mouth 4 (four) times daily -  with meals and at bedtime. 08/23/18   Derwood Kaplan, MD    Family History History reviewed. No pertinent family history.  Social History Social History   Tobacco Use  . Smoking status: Current Every Day Smoker    Packs/day: 0.30    Years: 30.00    Pack years: 9.00  . Smokeless tobacco: Never Used  . Tobacco comment: cutting back  Substance Use Topics  . Alcohol use: No    Comment: occ glass of wine  . Drug use: No     Allergies   Wellbutrin [bupropion hcl]   Review of Systems Review of Systems  Constitutional: Positive for activity change.  Respiratory: Negative for shortness of breath.   Cardiovascular: Negative for chest pain.  Gastrointestinal: Positive for abdominal pain.  All other systems reviewed and are negative.    Physical Exam Updated Vital Signs BP 126/79 (BP Location: Left Arm)   Pulse 67   Temp 97.7 F (36.5 C) (Oral)   Resp 17   SpO2 94%   Physical Exam Vitals signs and nursing note reviewed.  Constitutional:      Appearance: She is well-developed.  HENT:     Head: Normocephalic and atraumatic.  Neck:     Musculoskeletal: Normal range of motion and neck supple.  Cardiovascular:     Rate and Rhythm: Normal rate.  Pulmonary:     Effort: Pulmonary effort is normal.  Abdominal:     General: Bowel sounds are normal.     Tenderness: There is generalized abdominal tenderness. There is no guarding or rebound.  Skin:    General: Skin is warm and dry.  Neurological:     Mental Status: She is alert and oriented to person, place, and time.      ED  Treatments / Results  Labs (all labs ordered are listed, but only abnormal results are displayed) Labs Reviewed  LIPASE, BLOOD - Abnormal; Notable for the following components:      Result Value   Lipase 99 (*)    All other components within normal limits  COMPREHENSIVE METABOLIC PANEL - Abnormal; Notable for the following components:   Glucose, Bld 113 (*)    All other components within normal limits  URINALYSIS, ROUTINE W REFLEX MICROSCOPIC - Abnormal; Notable for the following components:   Hgb urine dipstick SMALL (*)    All other components within normal limits  I-STAT BETA HCG BLOOD, ED ([facility], [facility], [facility] ONLY) - Abnormal;  Notable for the following components:   I-stat hCG, quantitative 7.7 (*)    All other components within normal limits  CBC    EKG None  Radiology No results found.  Procedures Procedures (including critical care time)  Medications Ordered in ED Medications  sodium chloride flush (NS) 0.9 % injection 3 mL (3 mLs Intravenous Not Given 08/23/18 0337)  hydrOXYzine (ATARAX/VISTARIL) tablet 25 mg (25 mg Oral Given 08/23/18 0337)  sucralfate (CARAFATE) tablet 1 g (1 g Oral Given 08/23/18 0337)  predniSONE (DELTASONE) tablet 60 mg (60 mg Oral Given 08/23/18 [id number])     Initial Impression / Assessment and Plan / ED Course  I have reviewed the triage vital signs and the nursing notes.  Pertinent labs & imaging results that were available during my care of the patient were reviewed by me and considered in my medical decision making (see chart for details).     58 year old female comes in with chief complaint of generalized abdominal pain that is been going on for 1 week.  The pain was initially intermittent, but now it is a constant pain for the past 3 days.  She went to urgent care, and was advised to come to the ER based on her acute abdominal series.  Patient's pain is moderately severe and constant, with that she has no rebound or guarding.  Pain is generalized in  location, and she has had no nausea/vomiting/bloating or severe constipation.  Based on my clinical assessment it does not appear she has small bowel obstruction nor does it appear that she has underlying acute emergent process.  Patient's lipase is elevated at 99.  I suspect that is because she has likely gastritis.  She states that her prior gastritis had similar symptoms.  We have advised patient to follow-up with [facility] for further evaluation.  Strict ER return precautions have been discussed, and patient is agreeing with the plan and is comfortable with the workup done and the recommendations from the ER.   Final Clinical Impressions(s) / ED Diagnoses   Final diagnoses:  PUD (peptic ulcer disease)  Elevated lipase    ED Discharge Orders         Ordered    omeprazole (PRILOSEC) 40 MG capsule  Daily     08/23/18 0408    sucralfate (CARAFATE) 1 GM/10ML suspension  3 times daily with meals & bedtime     08/23/18 0408    hydrOXYzine (ATARAX/VISTARIL) 25 MG tablet  Every 6 hours     08/23/18 0408    predniSONE (DELTASONE) 10 MG tablet  Daily     08/23/18 0408           Derwood KaplanNanavati, Malone Admire, MD 08/23/18 [phone]

## 2018-12-31 ENCOUNTER — Other Ambulatory Visit: Payer: Self-pay | Admitting: Family Medicine

## 2019-01-05 ENCOUNTER — Ambulatory Visit
Admission: RE | Admit: 2019-01-05 | Discharge: 2019-01-05 | Disposition: A | Discharge status: Home / Self Care | Payer: Medicaid Other | Source: Ambulatory Visit | Attending: Family Medicine | Admitting: Family Medicine

## 2019-01-05 ENCOUNTER — Other Ambulatory Visit: Payer: Self-pay

## 2019-01-05 DIAGNOSIS — N6320 Unspecified lump in the left breast, unspecified quadrant: Secondary | ICD-10-CM

## 2020-02-15 ENCOUNTER — Other Ambulatory Visit: Payer: Self-pay | Admitting: Family Medicine

## 2020-02-15 DIAGNOSIS — N63 Unspecified lump in unspecified breast: Secondary | ICD-10-CM

## 2020-02-29 ENCOUNTER — Other Ambulatory Visit: Payer: Medicaid Other

## 2020-03-16 ENCOUNTER — Ambulatory Visit
Admission: RE | Admit: 2020-03-16 | Discharge: 2020-03-16 | Disposition: A | Discharge status: Home / Self Care | Payer: Medicaid Other | Source: Ambulatory Visit | Attending: Family Medicine | Admitting: Family Medicine

## 2020-03-16 ENCOUNTER — Other Ambulatory Visit: Payer: Self-pay

## 2020-03-16 DIAGNOSIS — N63 Unspecified lump in unspecified breast: Secondary | ICD-10-CM

## 2021-08-29 ENCOUNTER — Other Ambulatory Visit: Payer: Self-pay | Admitting: Family Medicine

## 2021-08-29 ENCOUNTER — Other Ambulatory Visit: Payer: Self-pay | Admitting: Nurse Practitioner

## 2021-08-29 DIAGNOSIS — Z1231 Encounter for screening mammogram for malignant neoplasm of breast: Secondary | ICD-10-CM

## 2021-09-06 ENCOUNTER — Ambulatory Visit
Admission: RE | Admit: 2021-09-06 | Discharge: 2021-09-06 | Disposition: A | Discharge status: Home / Self Care | Payer: Medicaid Other | Source: Ambulatory Visit | Attending: Nurse Practitioner | Admitting: Nurse Practitioner

## 2021-09-06 DIAGNOSIS — Z1231 Encounter for screening mammogram for malignant neoplasm of breast: Secondary | ICD-10-CM
